# Patient Record
Sex: Female | Born: 1955 | Race: White | Hispanic: No | Smoking: Never smoker
Health system: Southern US, Community
[De-identification: ages and names within clinical notes are randomized; demographics above are authoritative.]

## PROBLEM LIST (undated history)

## (undated) DIAGNOSIS — R112 Nausea with vomiting, unspecified: Secondary | ICD-10-CM

## (undated) DIAGNOSIS — I10 Essential (primary) hypertension: Secondary | ICD-10-CM

## (undated) DIAGNOSIS — Z9889 Other specified postprocedural states: Secondary | ICD-10-CM

## (undated) DIAGNOSIS — R51 Headache: Secondary | ICD-10-CM

## (undated) DIAGNOSIS — N200 Calculus of kidney: Secondary | ICD-10-CM

## (undated) HISTORY — PX: ABDOMINAL HYSTERECTOMY: SHX81

## (undated) HISTORY — PX: COLONOSCOPY: SHX174

## (undated) HISTORY — PX: BACK SURGERY: SHX140

## (undated) HISTORY — PX: JOINT REPLACEMENT: SHX530

## (undated) HISTORY — PX: TUBAL LIGATION: SHX77

---

## 2002-08-09 ENCOUNTER — Other Ambulatory Visit: Admission: RE | Admit: 2002-08-09 | Discharge: 2002-08-09 | Payer: Self-pay | Admitting: Family Medicine

## 2003-04-05 ENCOUNTER — Encounter: Payer: Self-pay | Admitting: Family Medicine

## 2003-04-05 ENCOUNTER — Encounter: Admission: RE | Admit: 2003-04-05 | Discharge: 2003-04-05 | Payer: Self-pay | Admitting: Family Medicine

## 2003-04-25 ENCOUNTER — Encounter: Admission: RE | Admit: 2003-04-25 | Discharge: 2003-04-25 | Payer: Self-pay | Admitting: Family Medicine

## 2003-04-25 ENCOUNTER — Encounter: Payer: Self-pay | Admitting: Family Medicine

## 2003-07-26 ENCOUNTER — Encounter: Payer: Self-pay | Admitting: Radiology

## 2003-07-26 ENCOUNTER — Encounter: Payer: Self-pay | Admitting: Neurosurgery

## 2003-07-26 ENCOUNTER — Encounter: Admission: RE | Admit: 2003-07-26 | Discharge: 2003-07-26 | Payer: Self-pay | Admitting: Neurosurgery

## 2003-08-16 ENCOUNTER — Encounter: Payer: Self-pay | Admitting: Neurosurgery

## 2003-08-16 ENCOUNTER — Encounter: Admission: RE | Admit: 2003-08-16 | Discharge: 2003-08-16 | Payer: Self-pay | Admitting: Neurosurgery

## 2003-08-29 ENCOUNTER — Encounter: Admission: RE | Admit: 2003-08-29 | Discharge: 2003-08-29 | Payer: Self-pay | Admitting: Neurosurgery

## 2003-08-29 ENCOUNTER — Encounter: Payer: Self-pay | Admitting: Neurosurgery

## 2003-12-02 ENCOUNTER — Ambulatory Visit (HOSPITAL_COMMUNITY): Admission: RE | Admit: 2003-12-02 | Discharge: 2003-12-02 | Payer: Self-pay | Admitting: Neurosurgery

## 2003-12-25 ENCOUNTER — Encounter: Admission: RE | Admit: 2003-12-25 | Discharge: 2003-12-25 | Payer: Self-pay | Admitting: *Deleted

## 2003-12-30 ENCOUNTER — Encounter: Admission: RE | Admit: 2003-12-30 | Discharge: 2003-12-30 | Payer: Self-pay | Admitting: Neurosurgery

## 2004-01-13 ENCOUNTER — Encounter: Admission: RE | Admit: 2004-01-13 | Discharge: 2004-01-13 | Payer: Self-pay | Admitting: Neurosurgery

## 2004-01-28 ENCOUNTER — Emergency Department (HOSPITAL_COMMUNITY): Admission: EM | Admit: 2004-01-28 | Discharge: 2004-01-28 | Payer: Self-pay | Admitting: Emergency Medicine

## 2004-01-31 ENCOUNTER — Encounter: Admission: RE | Admit: 2004-01-31 | Discharge: 2004-01-31 | Payer: Self-pay | Admitting: Neurosurgery

## 2005-06-11 ENCOUNTER — Encounter: Admission: RE | Admit: 2005-06-11 | Discharge: 2005-06-11 | Payer: Self-pay | Admitting: Internal Medicine

## 2006-03-30 ENCOUNTER — Encounter: Admission: RE | Admit: 2006-03-30 | Discharge: 2006-03-30 | Payer: Self-pay | Admitting: Internal Medicine

## 2008-03-18 ENCOUNTER — Encounter: Admission: RE | Admit: 2008-03-18 | Discharge: 2008-03-18 | Payer: Self-pay | Admitting: Internal Medicine

## 2009-12-17 ENCOUNTER — Encounter: Admission: RE | Admit: 2009-12-17 | Discharge: 2009-12-17 | Payer: Self-pay | Admitting: Family Medicine

## 2010-12-14 ENCOUNTER — Inpatient Hospital Stay (HOSPITAL_COMMUNITY)
Admission: RE | Admit: 2010-12-14 | Discharge: 2010-12-17 | Payer: Self-pay | Source: Home / Self Care | Attending: Orthopedic Surgery | Admitting: Orthopedic Surgery

## 2010-12-14 LAB — DIFFERENTIAL
Basophils Absolute: 0 10*3/uL (ref 0.0–0.1)
Basophils Relative: 1 % (ref 0–1)
Eosinophils Absolute: 0.3 10*3/uL (ref 0.0–0.7)
Eosinophils Relative: 4 % (ref 0–5)
Lymphocytes Relative: 35 % (ref 12–46)
Lymphs Abs: 2.5 10*3/uL (ref 0.7–4.0)
Monocytes Absolute: 0.3 10*3/uL (ref 0.1–1.0)
Monocytes Relative: 4 % (ref 3–12)
Neutro Abs: 4.2 10*3/uL (ref 1.7–7.7)
Neutrophils Relative %: 57 % (ref 43–77)

## 2010-12-14 LAB — CBC
HCT: 41.1 % (ref 36.0–46.0)
Hemoglobin: 14.4 g/dL (ref 12.0–15.0)
MCH: 30.8 pg (ref 26.0–34.0)
MCHC: 35 g/dL (ref 30.0–36.0)
MCV: 88 fL (ref 78.0–100.0)
Platelets: 232 10*3/uL (ref 150–400)
RBC: 4.67 MIL/uL (ref 3.87–5.11)
RDW: 13.4 % (ref 11.5–15.5)
WBC: 7.4 10*3/uL (ref 4.0–10.5)

## 2010-12-14 LAB — URINALYSIS, ROUTINE W REFLEX MICROSCOPIC
Bilirubin Urine: NEGATIVE
Ketones, ur: NEGATIVE mg/dL
Leukocytes, UA: NEGATIVE
Nitrite: NEGATIVE
Protein, ur: NEGATIVE mg/dL
Specific Gravity, Urine: 1.006 (ref 1.005–1.030)
Urine Glucose, Fasting: NEGATIVE mg/dL
Urobilinogen, UA: 0.2 mg/dL (ref 0.0–1.0)
pH: 7 (ref 5.0–8.0)

## 2010-12-14 LAB — BASIC METABOLIC PANEL
BUN: 10 mg/dL (ref 6–23)
CO2: 29 mEq/L (ref 19–32)
Calcium: 9.6 mg/dL (ref 8.4–10.5)
Chloride: 102 mEq/L (ref 96–112)
Creatinine, Ser: 0.78 mg/dL (ref 0.4–1.2)
GFR calc Af Amer: >60 mL/min (ref >60)
GFR calc non Af Amer: >60 mL/min (ref >60)
Glucose, Bld: 113 mg/dL — ABNORMAL HIGH (ref 70–99)
Potassium: 3.6 mEq/L (ref 3.5–5.1)
Sodium: 140 mEq/L (ref 135–145)

## 2010-12-14 LAB — PROTIME-INR
INR: 0.89 (ref 0.00–1.49)
Prothrombin Time: 12.2 seconds (ref 11.6–15.2)

## 2010-12-14 LAB — URINE MICROSCOPIC-ADD ON

## 2010-12-14 LAB — SURGICAL PCR SCREEN
MRSA, PCR: NEGATIVE
Staphylococcus aureus: NEGATIVE

## 2010-12-14 LAB — APTT: aPTT: 27 seconds (ref 24–37)

## 2010-12-16 LAB — CBC
HCT: 35.2 % — ABNORMAL LOW (ref 36.0–46.0)
Hemoglobin: 11.7 g/dL — ABNORMAL LOW (ref 12.0–15.0)
MCH: 29.3 pg (ref 26.0–34.0)
MCHC: 33.2 g/dL (ref 30.0–36.0)
MCV: 88 fL (ref 78.0–100.0)
Platelets: 200 10*3/uL (ref 150–400)
RBC: 4 MIL/uL (ref 3.87–5.11)
RDW: 13.4 % (ref 11.5–15.5)
WBC: 10.4 10*3/uL (ref 4.0–10.5)

## 2010-12-16 LAB — BASIC METABOLIC PANEL
BUN: 8 mg/dL (ref 6–23)
CO2: 27 mEq/L (ref 19–32)
Calcium: 8.1 mg/dL — ABNORMAL LOW (ref 8.4–10.5)
Chloride: 103 mEq/L (ref 96–112)
Creatinine, Ser: 0.83 mg/dL (ref 0.4–1.2)
GFR calc Af Amer: >60 mL/min (ref >60)
GFR calc non Af Amer: >60 mL/min (ref >60)
Glucose, Bld: 158 mg/dL — ABNORMAL HIGH (ref 70–99)
Potassium: 4 mEq/L (ref 3.5–5.1)
Sodium: 135 mEq/L (ref 135–145)

## 2010-12-16 LAB — PROTIME-INR
INR: 1.03 (ref 0.00–1.49)
Prothrombin Time: 13.7 seconds (ref 11.6–15.2)

## 2010-12-16 LAB — ABO/RH: ABO/RH(D): A POS

## 2010-12-16 LAB — TYPE AND SCREEN
ABO/RH(D): A POS
Antibody Screen: NEGATIVE

## 2010-12-17 NOTE — Op Note (Signed)
NAMETRISTINE, LANGI               ACCOUNT NO.:  [id number]  MEDICAL RECORD NO.:  [id number]          PATIENT TYPE:  INP  LOCATION:  5030                         FACILITY:  [facility]  PHYSICIAN:  Feliberto Gottron. Turner Daniels, M.D.   DATE OF BIRTH:  1956/02/06  DATE OF PROCEDURE:  12/14/2010 DATE OF DISCHARGE:                              OPERATIVE REPORT   PREOPERATIVE DIAGNOSIS:  End-stage arthritis of the left knee.  POSTOPERATIVE DIAGNOSIS:  End-stage arthritis of the left knee.  PROCEDURE:  Left total knee arthroplasty using DePuy Sigma RP components, 4 narrow left femur, 4 tibial baseplate, 10-mm Sigma RP bearing, and a 35-mm patellar button double batch of DePuy, HV cement with 1500 mg of Zinacef.  All components cemented.  SURGEON:  Feliberto Gottron. Turner Daniels, MD  FIRST ASSISTANT:  Shirl Harris, PA-C.  ANESTHETIC:  General endotracheal.  ESTIMATED BLOOD LOSS:  Minimal.  FLUID REPLACEMENT:  1500 mL of crystalloid.  DRAINS PLACED:  Foley catheter, 2 medium Hemovac.  TOURNIQUET TIME:  One hour and 25 minutes.  INDICATIONS FOR PROCEDURE:  The patient is a 55 year old woman with bare bone arthritis of her left knee along the medial compartment who has failed conservative treatment with anti-inflammatory medicines, physical therapy, intra-articular injections of cortisone.  Discussed supplementation and finally arthroscopic debridement which revealed bare bone arthritic changes to the far medial aspects of the medial tibial plateau and medial femoral condyle.  She desires elective left total knee arthroplasty.  Risks and benefits of surgery were discussed, questions answered.  DESCRIPTION OF PROCEDURE:  The patient identified by armband and received preoperative IV antibiotics in the holding area at [facility], taken to operating room 5, appropriate anesthetic monitors were attached, endotracheal anesthesia induced with the patient in the supine position.  A Foley catheter was  inserted.  Lateral post and foot positioner applied to the table.  Tourniquet applied high to the left thigh.  Left lower extremity prepped and draped in usual sterile fashion from the ankle to the tourniquet.  Time-out procedure was performed. The limb was then wrapped with an Esmarch bandage.  The tourniquet inflated to 350 mmHg.  We began the operation itself by making an anterior midline incision one handbreadth above the patella over the patella and then 1 cm medial to and 3 cm distal to the tibial tubercle. Small bleeders in the skin and subcutaneous tissue were identified and cauterized.  Transverse retinaculum was incised and reflected medially allowing a medial parapatellar arthrotomy.  The patella was everted, prepatellar fat pad was resected.  Superficial medial collateral ligament was elevated from anterior to posterior off the proximal flare of the tibia and the patella was everted, the knee was hyperflexed. This allowed [place] to resect the anterior one half of the menisci as well as the cruciate ligaments after first removing large notch osteophytes. With the knee hyperflexed, we then entered the proximal tibia in line with the shaft of the tibia with a step drill followed by the intramedullary guide and 2-degree posterior slope cutting guide.  The cutting guide was set so that we resected 4-5 mm of bone medially, 8-9 mm of  bone laterally, and a proximal tibial cut accomplished without difficulty.  We then entered the distal femur 2 mm anterior to the PCL origin with a step drill followed by the IM rod and a distal femoral cutting guide set at 11 mm, pinned along the epicondylar axis.  The distal 11 mm of the femur were resected.  The posterior referencing cutting guide was then affixed to the femur and we measured for a 4 chamfer cutting guide, although by measuring with the trials, it looked like would be a 4 narrow.  The cutting guide was pinned in 3 degrees of external  rotation and the anterior, posterior, and chamfer cuts accomplished without difficulty followed by the Sigma RP box cut.  The 4 narrow did have the best fit.  At this point, the knee was brought into full extension.  We removed the posterior half of the meniscus, checked our extension gap which was good for a 10-mm bearing as well as the flexion gap.  We measured the patella 22 mm, felt it would fit a 32 or 35 button.  Cutting guide was set at 14 and the posterior 8-9 mm of the patella resected, sized for 35 button and drilled.  Once again, the knee was hyperflexed, exposing the proximal tibia.  We sized for #4 tibial baseplate, pinned it in place, brought up the smokestack and conical reamer followed by the Delta fin keel punch.  We then hammered into place a 4 left trial, drilled the lugs, inserted a 10-mm bearing.  A 35 trial patella was then also placed, the knee was brought to full extension and flexed to 140 degrees without difficulty.  Collateral ligaments were noted to be stable.  Trial components were removed at this time.  All bony surface were water picked, cleaned, dried with suction and sponges.  Double batch of DePuy HV cement was then mixed and applied to all bony metallic mating surfaces except for the posterior condyles of the femur itself.  In order we hammered into place a 4 tibial baseplate and removed excess cement, a 4 left femoral component and removed excess cement, inserted a 10-mm Sigma RP bearing and reduced the knee.  The 35 patellar button was squeezed into place with a clamp and excess cement removed.  The wound was water picked, cleaned with pulse lavage.  Medium Hemovac drains were placed from the anterolateral approach.  After the cement cured, we removed the clamp, reduced the patella, took the knee through range of motion, noted tracking with no thumb pressure and at this point, the parapatellar arthrotomy was closed with running #1 Vicryl suture, the  subcutaneous tissue with 0 and 2-0 undyed Vicryl suture, and the skin with skin staples.  A dressing of Xeroform, 4 x 4s, dressing sponges, Webril, and Ace wrap applied. Tourniquet let down.  The patient awakened, extubated, and taken to the recovery room without difficulty.     Feliberto Gottron. Turner Daniels, M.D.     Ovid Curd  D:  12/14/2010  T:  12/15/2010  Job:  [id number]  Electronically Signed by Gean Birchwood M.D. on 12/17/2010 01:25:46 PM

## 2010-12-21 LAB — CBC
HCT: 34.4 % — ABNORMAL LOW (ref 36.0–46.0)
HCT: 29.9 % — ABNORMAL LOW (ref 36.0–46.0)
Hemoglobin: 11.4 g/dL — ABNORMAL LOW (ref 12.0–15.0)
Hemoglobin: 9.9 g/dL — ABNORMAL LOW (ref 12.0–15.0)
MCH: 29.3 pg (ref 26.0–34.0)
MCH: 29.3 pg (ref 26.0–34.0)
MCHC: 33.1 g/dL (ref 30.0–36.0)
MCHC: 33.1 g/dL (ref 30.0–36.0)
MCV: 88.4 fL (ref 78.0–100.0)
MCV: 88.5 fL (ref 78.0–100.0)
Platelets: 176 10*3/uL (ref 150–400)
Platelets: 156 10*3/uL (ref 150–400)
RBC: 3.89 MIL/uL (ref 3.87–5.11)
RBC: 3.38 MIL/uL — ABNORMAL LOW (ref 3.87–5.11)
RDW: 13.4 % (ref 11.5–15.5)
RDW: 13.4 % (ref 11.5–15.5)
WBC: 11 10*3/uL — ABNORMAL HIGH (ref 4.0–10.5)
WBC: 7.7 10*3/uL (ref 4.0–10.5)

## 2010-12-21 LAB — PROTIME-INR
INR: 1.22 (ref 0.00–1.49)
Prothrombin Time: 15.6 seconds — ABNORMAL HIGH (ref 11.6–15.2)
Prothrombin Time: 18.7 seconds — ABNORMAL HIGH (ref 11.6–15.2)

## 2011-01-08 NOTE — Discharge Summary (Signed)
Bridget Salas, [place]               ACCOUNT NO.:  [id number]  MEDICAL RECORD NO.:  [id number]          PATIENT TYPE:  INP  LOCATION:  5030                         FACILITY:  [facility]  PHYSICIAN:  Feliberto Gottron. Turner Daniels, M.D.   DATE OF BIRTH:  1956/08/07  DATE OF ADMISSION:  12/14/2010 DATE OF DISCHARGE:  12/17/2010                              DISCHARGE SUMMARY   CHIEF COMPLAINT:  Left knee pain.  HISTORY OF PRESENT ILLNESS:  This is a 55 year old lady who complains of severe unremitting pain in her left knee despite extensive conservative treatment with antiinflammatories, cortisone injection, viscosupplementation, and arthroscopy.  She now desires a surgical intervention.  All risks and benefits of surgery were discussed with the patient.  PAST MEDICAL HISTORY:  Significant for migraines.  PAST SURGICAL HISTORY:  Significant for hysterectomy, diskectomy, and left knee arthroscopy.  SOCIAL HISTORY:  She denies use of alcohol or tobacco.  FAMILY HISTORY:  Positive for diabetes, hypertension, and coronary artery disease.  She has no known drug allergies.  PHYSICAL EXAMINATION:  Gross examination of the left knee demonstrates range of motion to be 0-110 degrees.  She is tender to palpation along the medial joint line and is neurovascularly intact.  X-rays demonstrate significant arthritic changes in the medial compartment of the left knee.  At the time of arthroscopy, she was found to have grade 4 chondromalacia.  PREOPERATIVE LABORATORY DATA:  White blood cells 7.4, red blood cells 4.67, hemoglobin 14.4, hematocrit 41.1, platelets 232.  PT 12.2, INR 0.89, PTT 27.  Sodium 140, potassium 3.6, chloride 102, glucose 113, BUN 10, creatinine 0.78.  Urinalysis was within normal limits.  HOSPITAL COURSE:  [name] was admitted to [facility] on December 14, 2010, when she underwent left total knee arthroplasty.  The procedure was performed by Dr. Gean Birchwood, and she tolerated it well.   Two Hemovac drains were placed in the left knee.  A perioperative Foley catheter was also placed.  She was transferred to the floor on Lovenox and Coumadin for DVT prophylaxis.  On the first postoperative day, she was awake and alert.  She is reporting 9/10 pain in her left knee, but seemed to be tolerating p.o. intake well.  Hemoglobin was 11.7.  Her drains were removed without difficulty.  On the second postoperative day.  She reported improvement in her knee pain.  Hemoglobin was 11.4. Surgical dressing was changed, and her incision was found to be benign. On postoperative day #3, she had passed all of her physical therapy goals.  She was eating well.  Hemoglobin was 11.4.  Surgical dressing remained clean, and she was discharged home.  DISPOSITION:  The patient was discharged home on December 17, 2010.  She was weightbearing as tolerated and would return to the clinic in 10 days for x-rays and staple removal.  Home health care would manage her wound, Coumadin, and physical therapy.  She will be on Coumadin for 14 days with a target INR of 1.5-2.  FINAL DIAGNOSIS:  End-stage degenerative joint disease of the left knee.     Shirl Harris, PA   ______________________________ Feliberto Gottron. Turner Daniels, M.D.  JW/MEDQ  D:  01/05/2011  T:  01/05/2011  Job:  [id number]  Electronically Signed by Shirl Harris PA on 01/07/2011 01:27:13 PM Electronically Signed by Gean Birchwood M.D. on 01/08/2011 10:07:22 PM

## 2011-04-16 NOTE — Op Note (Signed)
NAMEMAKAYLE, Bridget Salas                         ACCOUNT NO.:  [id number]   MEDICAL RECORD NO.:  [id number]                   PATIENT TYPE:  OIB   LOCATION:  2899                                 FACILITY:  [facility]   PHYSICIAN:  Kathaleen Maser. Pool, M.D.                 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   OPERATION PERFORMED:  Right L4-5 extraforaminal microdiskectomy.   SURGEON:  Kathaleen Maser. Pool, M.D.   ASSISTANT:  Donalee Citrin, M.D.   ANESTHESIA:  General endotracheal.   INDICATIONS FOR PROCEDURE:  [name] is a 55 year old female with a  history of right lower extremity pain consistent with a right-sided L4  radiculopathy which has failed conservative treatment. MRI scan demonstrates  evidence of foraminal and extraforaminal disk herniation off to the right  side at L4-5 with compression of the exiting L4 nerve root.  The patient was  then counseled as to her options.  She has decided to proceed with a right-  sided L4-5 extraforaminal microdiskectomy for hopeful improvement of the  symptoms.   DESCRIPTION OF PROCEDURE:  The patient was taken to the operating room and  placed on the table in the supine position.  After adequate level of  anesthesia was achieved, the patient was positioned prone onto a Wilson  frame and appropriately padded.  The patient's lumbar region was prepped and  draped sterilely.  A 10 blade was used to make a linear skin incision  overlying the L4-5 interspace.  This was carried down sharply in the  midline.  A subperiosteal dissection was then performed exposing the lamina  and facet joints of L4 and L5 on the right as well as the transverse  processes of L4.  Deep  self-retaining retractor was placed.  Intraoperative  x-ray was taken, the level was confirmed.  Extraforaminal approach was then  performed using high speed drill and Kerrison rongeurs to remove the lateral  aspect of the superior facet of L5 and the inferior facet of L4.  The  intertransverse ligament was elevated and resected in piecemeal fashion  using Kerrison rongeurs.   The microscope was brought into the field and used for microdissection of  the right-sided L4 nerve root and underlying disk herniation.  The right-  sided L4 nerve root was identified and dissected free.  Epidural fat was  coagulated and resected.  The disk space was isolated and incised with a 15  blade in rectangular fashion.  A wide disk space cleanout was then achieved  pituitary rongeurs and upward angled pituitary rongeurs and Epstein curets.  All elements of the foraminal and extraforaminal disk herniation were  completely removed.  This completely decompressed the right-sided L4 nerve  root.  There was no evidence of any residual compression.  The wound was  then irrigated with antibiotic solution.  Gelfoam was placed topically for  hemostasis which was found to be good.  Microscope and retractor system were removed.  Hemostasis in the muscle was  achieved with electrocautery.  The wound was then closed in layers with  Vicryl sutures.  Steri-Strips and sterile dressing were applied.  There were  no apparent complications.  The patient tolerated the procedure well and  returned to the recovery room postoperatively.                                               Henry A. Pool, M.D.    HAP/MEDQ  D:  12/02/2003  T:  12/02/2003  Job:  [id number]

## 2014-05-20 ENCOUNTER — Encounter (HOSPITAL_COMMUNITY): Payer: Self-pay | Admitting: Pharmacist

## 2014-05-23 ENCOUNTER — Encounter (HOSPITAL_COMMUNITY): Payer: Self-pay | Admitting: *Deleted

## 2014-05-29 ENCOUNTER — Encounter (HOSPITAL_COMMUNITY): Payer: Managed Care, Other (non HMO) | Admitting: Anesthesiology

## 2014-05-29 ENCOUNTER — Encounter (HOSPITAL_COMMUNITY): Payer: Self-pay | Admitting: *Deleted

## 2014-05-29 ENCOUNTER — Ambulatory Visit (HOSPITAL_COMMUNITY): Payer: Managed Care, Other (non HMO) | Admitting: Anesthesiology

## 2014-05-29 ENCOUNTER — Ambulatory Visit (HOSPITAL_COMMUNITY)
Admission: RE | Admit: 2014-05-29 | Discharge: 2014-05-30 | Disposition: A | Discharge status: Home / Self Care | Payer: Managed Care, Other (non HMO) | Source: Ambulatory Visit | Attending: Obstetrics and Gynecology | Admitting: Obstetrics and Gynecology

## 2014-05-29 ENCOUNTER — Encounter (HOSPITAL_COMMUNITY)
Admission: RE | Disposition: A | Discharge status: Home / Self Care | Payer: Self-pay | Source: Ambulatory Visit | Attending: Obstetrics and Gynecology

## 2014-05-29 DIAGNOSIS — N8111 Cystocele, midline: Secondary | ICD-10-CM | POA: Insufficient documentation

## 2014-05-29 DIAGNOSIS — E7800 Pure hypercholesterolemia, unspecified: Secondary | ICD-10-CM | POA: Insufficient documentation

## 2014-05-29 DIAGNOSIS — N200 Calculus of kidney: Secondary | ICD-10-CM | POA: Insufficient documentation

## 2014-05-29 DIAGNOSIS — Z9889 Other specified postprocedural states: Secondary | ICD-10-CM

## 2014-05-29 DIAGNOSIS — Z833 Family history of diabetes mellitus: Secondary | ICD-10-CM | POA: Insufficient documentation

## 2014-05-29 DIAGNOSIS — Z8249 Family history of ischemic heart disease and other diseases of the circulatory system: Secondary | ICD-10-CM | POA: Insufficient documentation

## 2014-05-29 DIAGNOSIS — IMO0002 Reserved for concepts with insufficient information to code with codable children: Secondary | ICD-10-CM

## 2014-05-29 DIAGNOSIS — Z801 Family history of malignant neoplasm of trachea, bronchus and lung: Secondary | ICD-10-CM | POA: Insufficient documentation

## 2014-05-29 DIAGNOSIS — D071 Carcinoma in situ of vulva: Secondary | ICD-10-CM | POA: Insufficient documentation

## 2014-05-29 HISTORY — DX: Nausea with vomiting, unspecified: R11.2

## 2014-05-29 HISTORY — DX: Headache: R51

## 2014-05-29 HISTORY — DX: Other specified postprocedural states: Z98.890

## 2014-05-29 HISTORY — DX: Calculus of kidney: N20.0

## 2014-05-29 HISTORY — PX: CYSTOCELE REPAIR: SHX163

## 2014-05-29 LAB — CBC
HCT: 39.1 % (ref 36.0–46.0)
Hemoglobin: 13.4 g/dL (ref 12.0–15.0)
MCH: 31.4 pg (ref 26.0–34.0)
MCHC: 34.3 g/dL (ref 30.0–36.0)
MCV: 91.6 fL (ref 78.0–100.0)
PLATELETS: 175 10*3/uL (ref 150–400)
RBC: 4.27 MIL/uL (ref 3.87–5.11)
RDW: 13.9 % (ref 11.5–15.5)
WBC: 5.4 10*3/uL (ref 4.0–10.5)

## 2014-05-29 LAB — TYPE AND SCREEN
ABO/RH(D): A POS
Antibody Screen: NEGATIVE

## 2014-05-29 LAB — ABO/RH: ABO/RH(D): A POS

## 2014-05-29 SURGERY — COLPORRHAPHY, ANTERIOR, FOR CYSTOCELE REPAIR
Anesthesia: Spinal | Site: Vagina

## 2014-05-29 MED ORDER — ONDANSETRON HCL 4 MG/2ML IJ SOLN: 4.0000 mg | Freq: Four times a day (QID) | INTRAMUSCULAR | Status: DC | PRN

## 2014-05-29 MED ORDER — ESTRADIOL 0.1 MG/GM VA CREA
TOPICAL_CREAM | VAGINAL | Status: DC | PRN
  Administered 2014-05-29: 1 via VAGINAL

## 2014-05-29 MED ORDER — CEFAZOLIN SODIUM-DEXTROSE 2-3 GM-% IV SOLR
2.0000 g | INTRAVENOUS | Status: AC
  Administered 2014-05-29: 2 g via INTRAVENOUS

## 2014-05-29 MED ORDER — BUPIVACAINE IN DEXTROSE 0.75-8.25 % IT SOLN
INTRATHECAL | Status: DC | PRN
  Administered 2014-05-29: 15 mg via INTRATHECAL

## 2014-05-29 MED ORDER — MIDAZOLAM HCL 2 MG/2ML IJ SOLN: 0.5000 mg | Freq: Once | INTRAMUSCULAR | Status: DC | PRN

## 2014-05-29 MED ORDER — OXYCODONE-ACETAMINOPHEN 5-325 MG PO TABS: 1.0000 | ORAL_TABLET | ORAL | Status: DC | PRN

## 2014-05-29 MED ORDER — NALOXONE HCL 0.4 MG/ML IJ SOLN: 0.4000 mg | INTRAMUSCULAR | Status: DC | PRN

## 2014-05-29 MED ORDER — IBUPROFEN 800 MG PO TABS
800.0000 mg | ORAL_TABLET | Freq: Three times a day (TID) | ORAL | Status: DC
  Administered 2014-05-29 – 2014-05-30 (×2): 800 mg via ORAL
  Filled 2014-05-29 (×2): qty 1

## 2014-05-29 MED ORDER — MIDAZOLAM HCL 2 MG/2ML IJ SOLN
INTRAMUSCULAR | Status: AC
  Filled 2014-05-29: qty 2

## 2014-05-29 MED ORDER — LIDOCAINE HCL (CARDIAC) 20 MG/ML IV SOLN
INTRAVENOUS | Status: AC
  Filled 2014-05-29: qty 5

## 2014-05-29 MED ORDER — LIDOCAINE HCL (CARDIAC) 20 MG/ML IV SOLN
INTRAVENOUS | Status: DC | PRN
  Administered 2014-05-29: 50 mg via INTRAVENOUS

## 2014-05-29 MED ORDER — CEFAZOLIN SODIUM-DEXTROSE 2-3 GM-% IV SOLR
INTRAVENOUS | Status: AC
  Filled 2014-05-29: qty 50

## 2014-05-29 MED ORDER — DIPHENHYDRAMINE HCL 12.5 MG/5ML PO ELIX: 12.5000 mg | ORAL_SOLUTION | Freq: Four times a day (QID) | ORAL | Status: DC | PRN

## 2014-05-29 MED ORDER — DEXAMETHASONE SODIUM PHOSPHATE 10 MG/ML IJ SOLN
INTRAMUSCULAR | Status: AC
  Filled 2014-05-29: qty 1

## 2014-05-29 MED ORDER — SUMATRIPTAN SUCCINATE 100 MG PO TABS: 100.0000 mg | ORAL_TABLET | ORAL | Status: DC | PRN

## 2014-05-29 MED ORDER — DEXAMETHASONE SODIUM PHOSPHATE 10 MG/ML IJ SOLN
INTRAMUSCULAR | Status: DC | PRN
  Administered 2014-05-29: 5 mg via INTRAVENOUS

## 2014-05-29 MED ORDER — DIPHENHYDRAMINE HCL 50 MG/ML IJ SOLN: 12.5000 mg | Freq: Four times a day (QID) | INTRAMUSCULAR | Status: DC | PRN

## 2014-05-29 MED ORDER — FENTANYL CITRATE 0.05 MG/ML IJ SOLN
25.0000 ug | INTRAMUSCULAR | Status: DC | PRN
Start: 2014-05-29 — End: 2014-05-29

## 2014-05-29 MED ORDER — FENTANYL CITRATE 0.05 MG/ML IJ SOLN
INTRAMUSCULAR | Status: AC
  Filled 2014-05-29: qty 5

## 2014-05-29 MED ORDER — BUPIVACAINE-EPINEPHRINE (PF) 0.25% -1:200000 IJ SOLN
INTRAMUSCULAR | Status: AC
Start: 2014-05-29 — End: 2014-05-29
  Filled 2014-05-29: qty 30

## 2014-05-29 MED ORDER — EPHEDRINE 5 MG/ML INJ
INTRAVENOUS | Status: AC
  Filled 2014-05-29: qty 10

## 2014-05-29 MED ORDER — EPHEDRINE SULFATE 50 MG/ML IJ SOLN
INTRAMUSCULAR | Status: DC | PRN
  Administered 2014-05-29 (×6): 5 mg via INTRAVENOUS

## 2014-05-29 MED ORDER — HYDROMORPHONE 0.3 MG/ML IV SOLN
INTRAVENOUS | Status: DC
  Administered 2014-05-29: 17:00:00 via INTRAVENOUS
  Filled 2014-05-29: qty 25

## 2014-05-29 MED ORDER — PROPOFOL 10 MG/ML IV EMUL
INTRAVENOUS | Status: AC
  Filled 2014-05-29: qty 20

## 2014-05-29 MED ORDER — DOCUSATE SODIUM 100 MG PO CAPS
100.0000 mg | ORAL_CAPSULE | Freq: Two times a day (BID) | ORAL | Status: DC
  Administered 2014-05-29: 100 mg via ORAL
  Filled 2014-05-29: qty 1

## 2014-05-29 MED ORDER — LACTATED RINGERS IV SOLN
INTRAVENOUS | Status: DC
  Administered 2014-05-29 (×2): via INTRAVENOUS

## 2014-05-29 MED ORDER — PROPOFOL INFUSION 10 MG/ML OPTIME
INTRAVENOUS | Status: DC | PRN
  Administered 2014-05-29: 50 ug/kg/min via INTRAVENOUS

## 2014-05-29 MED ORDER — SODIUM CHLORIDE 0.9 % IJ SOLN: 9.0000 mL | INTRAMUSCULAR | Status: DC | PRN

## 2014-05-29 MED ORDER — MEPERIDINE HCL 25 MG/ML IJ SOLN: 6.2500 mg | INTRAMUSCULAR | Status: DC | PRN

## 2014-05-29 MED ORDER — FENTANYL CITRATE 0.05 MG/ML IJ SOLN
INTRAMUSCULAR | Status: DC | PRN
  Administered 2014-05-29: 25 ug via INTRATHECAL

## 2014-05-29 MED ORDER — ONDANSETRON HCL 4 MG/2ML IJ SOLN
INTRAMUSCULAR | Status: DC | PRN
  Administered 2014-05-29: 4 mg via INTRAVENOUS

## 2014-05-29 MED ORDER — ZOLPIDEM TARTRATE 5 MG PO TABS
5.0000 mg | ORAL_TABLET | Freq: Every evening | ORAL | Status: DC | PRN
  Administered 2014-05-29: 5 mg via ORAL
  Filled 2014-05-29: qty 1

## 2014-05-29 MED ORDER — ALUM & MAG HYDROXIDE-SIMETH 200-200-20 MG/5ML PO SUSP: 30.0000 mL | ORAL | Status: DC | PRN

## 2014-05-29 MED ORDER — KETOROLAC TROMETHAMINE 30 MG/ML IJ SOLN
INTRAMUSCULAR | Status: DC | PRN
  Administered 2014-05-29: 30 mg via INTRAVENOUS

## 2014-05-29 MED ORDER — KETOROLAC TROMETHAMINE 30 MG/ML IJ SOLN: 15.0000 mg | Freq: Once | INTRAMUSCULAR | Status: DC | PRN

## 2014-05-29 MED ORDER — MENTHOL 3 MG MT LOZG: 1.0000 | LOZENGE | OROMUCOSAL | Status: DC | PRN

## 2014-05-29 MED ORDER — MIDAZOLAM HCL 2 MG/2ML IJ SOLN
INTRAMUSCULAR | Status: DC | PRN
  Administered 2014-05-29: 2 mg via INTRAVENOUS

## 2014-05-29 MED ORDER — FENTANYL CITRATE 0.05 MG/ML IJ SOLN
INTRAMUSCULAR | Status: DC | PRN
  Administered 2014-05-29: 25 ug via INTRAVENOUS
  Administered 2014-05-29: 50 ug via INTRAVENOUS

## 2014-05-29 MED ORDER — BUPIVACAINE-EPINEPHRINE 0.25% -1:200000 IJ SOLN
INTRAMUSCULAR | Status: DC | PRN
  Administered 2014-05-29: 14 mL

## 2014-05-29 MED ORDER — ONDANSETRON HCL 4 MG/2ML IJ SOLN
INTRAMUSCULAR | Status: AC
  Filled 2014-05-29: qty 2

## 2014-05-29 MED ORDER — SIMETHICONE 80 MG PO CHEW: 80.0000 mg | CHEWABLE_TABLET | Freq: Four times a day (QID) | ORAL | Status: DC | PRN

## 2014-05-29 MED ORDER — ONDANSETRON HCL 4 MG PO TABS: 4.0000 mg | ORAL_TABLET | Freq: Four times a day (QID) | ORAL | Status: DC | PRN

## 2014-05-29 MED ORDER — ACETAMINOPHEN 10 MG/ML IV SOLN
1000.0000 mg | Freq: Once | INTRAVENOUS | Status: AC
  Administered 2014-05-29: 1000 mg via INTRAVENOUS
  Filled 2014-05-29: qty 100

## 2014-05-29 MED ORDER — PROMETHAZINE HCL 25 MG/ML IJ SOLN: 6.2500 mg | INTRAMUSCULAR | Status: DC | PRN

## 2014-05-29 MED ORDER — LACTATED RINGERS IV SOLN
INTRAVENOUS | Status: DC
  Administered 2014-05-29: 18:00:00 via INTRAVENOUS

## 2014-05-29 MED ORDER — ESTRADIOL 0.1 MG/GM VA CREA
TOPICAL_CREAM | VAGINAL | Status: AC
  Filled 2014-05-29: qty 42.5

## 2014-05-29 SURGICAL SUPPLY — 24 items
CATH ROBINSON RED A/P 16FR (CATHETERS) ×2 IMPLANT
CLOTH BEACON ORANGE TIMEOUT ST (SAFETY) ×2 IMPLANT
GAUZE PACKING 2X5 YD STRL (GAUZE/BANDAGES/DRESSINGS) ×2 IMPLANT
GLOVE BIO SURGEON STRL SZ 6 (GLOVE) ×2 IMPLANT
GLOVE BIO SURGEON STRL SZ7 (GLOVE) ×6 IMPLANT
GLOVE BIOGEL PI IND STRL 7.0 (GLOVE) ×3 IMPLANT
GLOVE BIOGEL PI INDICATOR 7.0 (GLOVE) ×3
GOWN STRL REUS W/TWL LRG LVL3 (GOWN DISPOSABLE) ×8 IMPLANT
NS IRRIG 1000ML POUR BTL (IV SOLUTION) ×2 IMPLANT
PACK VAGINAL WOMENS (CUSTOM PROCEDURE TRAY) ×2 IMPLANT
SUT VIC AB 0 CT1 18XCR BRD8 (SUTURE) ×1 IMPLANT
SUT VIC AB 0 CT1 27 (SUTURE) ×1
SUT VIC AB 0 CT1 27XBRD ANBCTR (SUTURE) ×1 IMPLANT
SUT VIC AB 0 CT1 8-18 (SUTURE) ×1
SUT VIC AB 2-0 CT1 27 (SUTURE)
SUT VIC AB 2-0 CT1 TAPERPNT 27 (SUTURE) IMPLANT
SUT VIC AB 2-0 SH 27 (SUTURE) ×2
SUT VIC AB 2-0 SH 27XBRD (SUTURE) ×2 IMPLANT
SUT VIC AB 2-0 UR5 27 (SUTURE) IMPLANT
SUT VIC AB 3-0 CT1 27 (SUTURE) ×1
SUT VIC AB 3-0 CT1 TAPERPNT 27 (SUTURE) ×1 IMPLANT
TOWEL OR 17X24 6PK STRL BLUE (TOWEL DISPOSABLE) ×4 IMPLANT
TRAY FOLEY CATH 14FR (SET/KITS/TRAYS/PACK) ×2 IMPLANT
WATER STERILE IRR 1000ML POUR (IV SOLUTION) ×2 IMPLANT

## 2014-05-29 NOTE — Brief Op Note (Signed)
05/29/2014  3:00 PM  PATIENT:  Bridget Salas  58 y.o. female  PRE-OPERATIVE DIAGNOSIS:  Cystocele, VIN 3  POST-OPERATIVE DIAGNOSIS:  cystocele  PROCEDURE:  Procedure(s): ANTERIOR REPAIR (CYSTOCELE) with wide  local excision (N/A) Wide local excision of vulva  SURGEON:  Surgeon(s) and Role:    * Geryl RankinsEvelyn Ghalia Reicks, MD - Primary    * Sharon SellerJennifer M Ozan, DO - Assisting  PHYSICIAN ASSISTANT: Dr. Charlotta Newtonzan  ASSISTANTS: Technician   ANESTHESIA:   local, spinal and IV sedation  EBL:  Total I/O In: 1700 [I.V.:1700] Out: 525 [Urine:500; Blood:25]  BLOOD ADMINISTERED:none  DRAINS: Urinary Catheter (Foley)   LOCAL MEDICATIONS USED:  MARCAINE   With 1:200,000 epinephrine  SPECIMEN:  Source of Specimen:  VIN 3 of perineum  DISPOSITION OF SPECIMEN:  PATHOLOGY  COUNTS:  YES  TOURNIQUET:  * No tourniquets in log *  DICTATION: .Other Dictation: Dictation Number [id number]  PLAN OF CARE: Admit for overnight observation  PATIENT DISPOSITION:  PACU - hemodynamically stable.   Delay start of Pharmacological VTE agent (>24hrs) due to surgical blood loss or risk of bleeding: yes

## 2014-05-29 NOTE — Progress Notes (Signed)
Patient ID: Bridget Salas, female   DOB: 10/16/1956, 58 y.o.   MRN: [id number]  Dictation number for op note [id number]

## 2014-05-29 NOTE — H&P (Signed)
  History of Present Illness  General:  58 y/o presents for preop. Anterior repair and wide local excision due to VIN 3 scheduled for tomorrow. Pt without complaints. Pt was using a cleanse that "thins the blood." Last used 5 days ago.   Current Medications  Taking   Cleanse and Treat , Notes: Isogenic   Not-Taking/PRN   Gummi Bear Multivitamin/Min Tablet Chewable as directed   Sumatriptan Succinate 100 mg Tablet 1 tablet as needed    Past Medical History  Hypercholesterolemia  Migraine headaches  Knee Pain Turner Daniels([place])  Kidney stones   Surgical History  hysterectomy abdominal due to uterine prolapse 1986  diskectomy   L knee surgery 2010  Left knee replacement 2012   Family History  Father: deceased, lung cancer  Mother: deceased, hypertension, diabetes Type II, heart disease, hypercholesterol, heart surgeries  Paternal Grand Father: deceased  Paternal Grand Mother: deceased, lived until [address]  Maternal Grand Father: deceased  Maternal Grand Mother: deceased, diabetes  Sister 1: deceased, suicide  Sister 2: alive  Sister 3: alive  3 sister(s) . 3 son(s) .   neg gi family hx, denies any GYN family cancer hx.   Social History  General:  Tobacco use  cigarettes: Never smoked Tobacco history last updated 05/28/2014 no Smoking.  no Alcohol.  no Caffeine.  no Recreational drug use.  Exercise: 4-5 times a week.  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  Marital Status: married.  Children: 3, son (s).    Gyn History  Sexual activity not currently sexually active.  Birth control hysterectomy.  Last pap smear date 2002.  Last mammogram date 2009.  Abnormal pap smear none.  STD none.    OB History  Number of pregnancies 3.  NVD 1 x SVD, 2 with a set of twins, EAB x 1.    Allergies  N.K.D.A.   Hospitalization/Major Diagnostic Procedure  hysterectomy   childbirth x 2    Review of Systems  Denies fever/chills, chest pain, SOB, headaches,  numbness/tingling. No h/o complication with anesthesia, bleeding disorders or blood clots.   Vital Signs  Wt 185, Ht 62, BMI 33.83, Pulse sitting 66, BP sitting 125/75.   Physical Examination  GENERAL:  Patient appears alert and oriented.  General Appearance: well-appearing, well-developed, no acute distress.  Speech: clear.  LUNGS:  Auscultation: no wheezing/rhonchi/rales. CTA bilaterally.  HEART:  Heart sounds: normal. RRR. no murmur.  ABDOMEN:  General: soft nontender, nondistended, no masses.  FEMALE GENITOURINARY:  Pelvic Not examined.  EXTREMITIES:  General: No edema or calf tenderness.     Assessments   1. Pre-op exam - V72.84 (Primary)   2. Cystocele without mention of uterine prolapse, midline - 618.01   3. 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 Pre-op exam  Notes: R/B/A of procedure discussed with pt at length. All questions answered. Consent obtained.    Follow Up  2 Weeks post op

## 2014-05-29 NOTE — Progress Notes (Signed)
In to assess pt. States pain is 0/10.  No n/v. VSS Routine post op care. Hold PCA. Discontinue vaginal packing and foley in am.

## 2014-05-29 NOTE — Anesthesia Postprocedure Evaluation (Signed)
Anesthesia Post Note  Patient: Bridget Salas  Procedure(s) Performed: Procedure(s) (LRB): ANTERIOR REPAIR (CYSTOCELE) with wide  local excision (N/A)  Anesthesia type: Spinal  Patient location: PACU  Post pain: Pain level controlled  Post assessment: Post-op Vital signs reviewed  Last Vitals:  Filed Vitals:   05/29/14 1515  BP: 91/43  Pulse: 50  Temp:   Resp: 15    Post vital signs: Reviewed  Level of consciousness: awake  Complications: No apparent anesthesia complications

## 2014-05-29 NOTE — Anesthesia Procedure Notes (Signed)
Spinal  Patient location during procedure: OR Start time: 05/29/2014 1:08 PM Staffing Anesthesiologist: Brayton CavesJACKSON, Dohn Stclair Performed by: anesthesiologist  Preanesthetic Checklist Completed: patient identified, site marked, surgical consent, pre-op evaluation, timeout performed, IV checked, risks and benefits discussed and monitors and equipment checked Spinal Block Patient position: sitting Prep: DuraPrep Patient monitoring: heart rate, cardiac monitor, continuous pulse ox and blood pressure Approach: midline Location: L3-4 Injection technique: single-shot Needle Needle type: Sprotte  Needle gauge: 24 G Needle length: 9 cm Assessment Sensory level: T4 Additional Notes Patient identified.  Risk benefits discussed including failed block, incomplete pain control, headache, nerve damage, paralysis, blood pressure changes, nausea, vomiting, reactions to medication both toxic or allergic, and postpartum back pain.  Patient expressed understanding and wished to proceed.  All questions were answered.  Sterile technique used throughout procedure and epidural site dressed with sterile barrier dressing. No paresthesia or other complications noted.The patient did not experience any signs of intravascular injection such as tinnitus or metallic taste in mouth nor signs of intrathecal spread such as rapid motor block. Please see nursing notes for vital signs.

## 2014-05-29 NOTE — Anesthesia Preprocedure Evaluation (Addendum)
Anesthesia Evaluation  Patient identified by MRN, date of birth, ID band Patient awake    Reviewed: Allergy & Precautions, H&P , Patient's Chart, lab work & pertinent test results, reviewed documented beta blocker date and time   History of Anesthesia Complications (+) history of anesthetic complications  Airway Mallampati: II TM Distance: >3 FB Neck ROM: full    Dental   Pulmonary  breath sounds clear to auscultation        Cardiovascular Exercise Tolerance: Good Rhythm:regular Rate:Normal     Neuro/Psych negative psych ROS   GI/Hepatic   Endo/Other    Renal/GU      Musculoskeletal   Abdominal   Peds  Hematology   Anesthesia Other Findings   Reproductive/Obstetrics                           Anesthesia Physical Anesthesia Plan  ASA: II  Anesthesia Plan: Spinal   Post-op Pain Management:    Induction:   Airway Management Planned:   Additional Equipment:   Intra-op Plan:   Post-operative Plan:   Informed Consent: I have reviewed the patients History and Physical, chart, labs and discussed the procedure including the risks, benefits and alternatives for the proposed anesthesia with the patient or authorized representative who has indicated his/her understanding and acceptance.   Dental Advisory Given  Plan Discussed with: CRNA, Surgeon and Anesthesiologist  Anesthesia Plan Comments:        Anesthesia Quick Evaluation

## 2014-05-29 NOTE — Interval H&P Note (Signed)
History and Physical Interval Note:  05/29/2014 12:51 PM  Bridget Salas  has presented today for surgery, with the diagnosis of Cystocele  The various methods of treatment have been discussed with the patient and family. After consideration of risks, benefits and other options for treatment, the patient has consented to  Procedure(s): ANTERIOR REPAIR (CYSTOCELE) (N/A) as a surgical intervention .  The patient's history has been reviewed, patient examined, no change in status, stable for surgery.  I have reviewed the patient's chart and labs.  Questions were answered to the patient's satisfaction.     Dion BodyVARNADO, Sierria Bruney

## 2014-05-29 NOTE — Discharge Instructions (Addendum)
Anterior and Posterior Colporrhaphy, Care After °Refer to this sheet in the next few weeks. These instructions provide you with information on caring for yourself after your procedure. Your health care provider may also give you more specific instructions. Your treatment has been planned according to current medical practices, but problems sometimes occur. Call your health care provider if you have any problems or questions after your procedure. °HOME CARE INSTRUCTIONS  °· Take frequent rest periods throughout the day.   °· Only take over-the-counter or prescription medicines as directed by your health care provider.   °· Avoid strenuous activity such as heavy lifting (more than 10 pounds [4.5 kg]), pushing, and pulling until your health care provider says it is okay.   °· Take showers if your health care provider approves. Pat incisions dry. Do not rub incisions with a washcloth or towel. Do not take tub baths until your health care provider approves.   °· Wear compression stockings as directed by your health care provider. These stockings help prevent blood clots from forming in your legs.   °· Talk with your health care provider about when you may return to work and your exercise routine.   °· Do not drive until your health care provider approves.   °· You may resume your normal diet. Eat a well-balanced diet.   °· Drink enough fluids to keep your urine clear or pale yellow.   °· Your normal bowel function should return. If you become constipated, you may:   °¨ Take a mild laxative.   °¨ Add fruit and bran to your diet.   °¨ Drink more fluids. °· Do not have sexual intercourse until permitted by your health care provider. °· Follow up with your health care provider as directed. °SEEK MEDICAL CARE IF: °You have persistent nausea or vomiting.  °SEEK IMMEDIATE MEDICAL CARE IF:  °· You have increased bleeding (more than a small spot) from the vaginal area.   °· Your pain is not relieved with medicine or becomes  worse.   °· You have redness, swelling, or increasing pain in the vaginal area.   °· You have abdominal pain.   °· You see pus coming from the wounds.   °· You develop a fever.   °· You have a foul smell coming from your vaginal area.   °· You develop light-headedness or you feel faint.   °· You have difficulty breathing.   °MAKE SURE YOU: °· Understand these instructions. °· Will watch your condition. °· Will get help right away if you are not doing well or get worse. °Document Released: 06/03/2005 Document Revised: 07/18/2013 Document Reviewed: 04/06/2013 °[employer]® Patient Information ©2015 [employer]. This information is not intended to replace advice given to you by your health care provider. Make sure you discuss any questions you have with your health care provider. ° °

## 2014-05-29 NOTE — Transfer of Care (Signed)
Immediate Anesthesia Transfer of Care Note  Patient: Bridget Salas  Procedure(s) Performed: Procedure(s): ANTERIOR REPAIR (CYSTOCELE) with wide  local excision (N/A)  Patient Location: PACU  Anesthesia Type:Spinal  Level of Consciousness: sedated  Airway & Oxygen Therapy: Patient Spontanous Breathing  Post-op Assessment: Report given to PACU RN  Post vital signs: Reviewed and stable  Complications: No apparent anesthesia complications

## 2014-05-30 ENCOUNTER — Encounter (HOSPITAL_COMMUNITY): Payer: Self-pay | Admitting: Obstetrics and Gynecology

## 2014-05-30 LAB — CBC
HEMATOCRIT: 33.8 % — AB (ref 36.0–46.0)
Hemoglobin: 11.4 g/dL — ABNORMAL LOW (ref 12.0–15.0)
MCH: 30.7 pg (ref 26.0–34.0)
MCHC: 33.7 g/dL (ref 30.0–36.0)
MCV: 91.1 fL (ref 78.0–100.0)
PLATELETS: 139 10*3/uL — AB (ref 150–400)
RBC: 3.71 MIL/uL — ABNORMAL LOW (ref 3.87–5.11)
RDW: 13.6 % (ref 11.5–15.5)
WBC: 11 10*3/uL — ABNORMAL HIGH (ref 4.0–10.5)

## 2014-05-30 MED ORDER — IBUPROFEN 800 MG PO TABS: 800.0000 mg | ORAL_TABLET | Freq: Three times a day (TID) | ORAL | Status: DC

## 2014-05-30 MED ORDER — OXYCODONE-ACETAMINOPHEN 5-325 MG PO TABS: 1.0000 | ORAL_TABLET | ORAL | Status: DC | PRN

## 2014-05-30 NOTE — Discharge Summary (Signed)
Physician Discharge Summary  Patient ID: Bridget ReelKaren L Wadel MRN: [id number] DOB/AGE: 59/01/1956 59 y.o.  Admit date: 05/29/2014 Discharge date: 05/30/2014  Admission Diagnoses:Grade 3 Cystocele, VIN 3   Discharge Diagnoses: S/p anterior repair, wide local excision   Discharged Condition: good  Hospital Course: Pt had spinal anesthesia.  Pain was 0/10 post op.  Vaginal packing placed and removed am after procedure.  Consults: None  Significant Diagnostic Studies: labs: post op Hg pending  Treatments: surgery: See above  Discharge Exam: Blood pressure 116/60, pulse 69, temperature 98 F (36.7 C), temperature source Oral, resp. rate 16, height 5\' 2"  (1.575 m), weight 83.915 kg (185 lb), SpO2 97.00%. Gen:  NAD Abdomen:  Soft, nontender Extremities:  No edema or calf tenderness  Disposition:   Discharge Instructions   Call MD for:  extreme fatigue    Complete by:  As directed      Call MD for:  persistant dizziness or light-headedness    Complete by:  As directed      Call MD for:  redness, tenderness, or signs of infection (pain, swelling, redness, odor or green/yellow discharge around incision site)    Complete by:  As directed      Call MD for:  severe uncontrolled pain    Complete by:  As directed      Diet - low sodium heart healthy    Complete by:  As directed      Discharge instructions    Complete by:  As directed   See discharge instructions.     Discharge wound care:    Complete by:  As directed   Keep wound clean and dry.  Can apply Neosporin TID.     Increase activity slowly    Complete by:  As directed      Sexual Activity Restrictions    Complete by:  As directed   Pelvic rest x 6 weeks.            Medication List         Fish Oil 300 MG Caps  Take 1 capsule by mouth daily.     ibuprofen 800 MG tablet  Commonly known as:  ADVIL,MOTRIN  Take 1 tablet (800 mg total) by mouth every 8 (eight) hours.     multivitamin with minerals tablet  Take 1 tablet  by mouth daily.     oxyCODONE-acetaminophen 5-325 MG per tablet  Commonly known as:  PERCOCET/ROXICET  Take 1-2 tablets by mouth every 4 (four) hours as needed for severe pain (moderate to severe pain (when tolerating fluids)).     SUMAtriptan 100 MG tablet  Commonly known as:  IMITREX  Take 100 mg by mouth every 2 (two) hours as needed for migraine or headache. May repeat in 2 hours if headache persists or recurs.           Follow-up Information   Follow up with Geryl RankinsVARNADO, Jaimere Feutz, MD In 2 weeks. (Post op check)    Specialty:  Obstetrics and Gynecology   Contact information:   [address], [address] [place] [address] [phone]       Signed: Geryl RankinsVARNADO, Nimco Bivens 05/30/2014, 5:26 AM

## 2014-05-30 NOTE — Progress Notes (Signed)
Pt is discharged in the care of husband with N>t> escort. Denies pain or discomfort. Spirits are good Discharge instructions with Rx were given to pt with good understanding Questions were asked and answered. No equipment neede for home.

## 2014-05-30 NOTE — Addendum Note (Signed)
Addendum created 05/30/14 [id number] by Gertie Feyynthia R Walker, CRNA   Modules edited: Notes Section   Notes Section:  File: [id number]

## 2014-05-30 NOTE — Progress Notes (Signed)
Vaginal packing removed as ordered, small amount dark vaginal drainage noted on packing.  Foley cath removed as ordered.  Patient tolerated well.

## 2014-05-30 NOTE — Op Note (Signed)
NAMHumphrey Rolls:  Treichler, Reganne               ACCOUNT NO.:  [id number]  MEDICAL RECORD NO.:  [id number]  LOCATION:  9320                          FACILITY:  [facility]  PHYSICIAN:  Pieter PartridgeEvelyn B Shown Dissinger, MD   DATE OF BIRTH:  1956-07-27  DATE OF PROCEDURE:  05/29/2014 DATE OF DISCHARGE:                              OPERATIVE REPORT   PREOPERATIVE DIAGNOSIS:  Cystocele and VIN3.  POSTOPERATIVE DIAGNOSIS:  Cystocele and VIN3.  PROCEDURES:  Anterior repair and wide local excision of vulva.  SURGEON:  Pieter PartridgeEvelyn B Kaceton Vieau, MD  PHYSICIAN ASSISTANT:  Dr. Charlotta Newtonzan and other assistant as technician.  ANESTHESIA:  Local, spinal IV sedation.  EBL:  25.  URINE:  500 mL at the beginning of the case.  BLOOD ADMINISTERED:  None.  DRAINS:  Foley catheter placed during the procedure.  LOCAL:  Marcaine with 1:200,000 epinephrine.  SPECIMEN:  VIN3 peritoneum.  Disposition of specimen is to Pathology.  DISPOSITION:  The patient's disposition is to PACU, hemodynamically stable.  COMPLICATIONS:  None.  FINDINGS:  Grade 3 to grade 4 cystocele with Valsalva, previously documented by biopsy a VIN3 lesion at 5 o'clock position on the left of the perineum, right below the introitus.  PROCEDURE IN DETAIL:  Ms. Duffy RhodyStanley was identified in the holding area. She was then taken to the operating room where she underwent spinal anesthesia without complication.  She was then given IV sedation, placed in the dorsal lithotomy position, and prepped and draped in normal sterile fashion.  A time-out was taken.  I and O catheterization of the bladder was performed with about 500 mL returned.  Vaginal cuff was then identified.  I initially put in a weighted speculum, however, with the prolapse, I was able to see the apex of the vaginal cuff, we removed that.  Two Allis clamps were used to grasp the vaginal cuff on the right and the left.  I then injected local anesthesia along the vaginal cuff and on the anterior vaginal mucosa.  A  transverse incision was then made with a scalpel.  Vaginal mucosa was then undermined with the Strully scissors.  A midline incision was made in the vaginal mucosa up to approximately 1.5 cm below the urethra.  Once this area was opened, the edges were grasped with the Allis clamps, and the fibers were then released with the Metzenbaum scissors and with the scalpel until the bladder was separated easily. On either side, there was minimal bleeding noted during the dissection, however, Bovie was needed on the left-hand side.  Sutures were then placed in the muscularis.  0 Vicryl pop-off were then used at the beginning and at the end.  I used figure-of-eight on the top.  I placed approximately 6 or 7 sutures.  I tied those down from the urethra down to the vaginal cuff.  I did place 1 stitch where there was a small opening.  Once those all were tied down, the bladder appeared well supported.  I then trimmed the vagina and the vaginal tissue.  That was tough and was not sent to Pathology.  I then reapproximated the vaginal mucosa with 3-0 Vicryl in a continuous fashion.  I was pleased with the  repair, and there was no excessive bleeding at the end.  Then turned my attention to the perineum.  After the Betadine prep, there was some difficulty seeing the demarcation of the lesion, but however, I cleaned it off with some wet sponge and dried it.  I then outlined the lesion, which was about 2 cm with the marking pen, and then I measured 1 cm in all directions.  Because of the shape of it, I almost did a J-like excision because 1 area back up to the introitus.  So, that area was marked 1 cm round.  I then took an 11 blade scalpel and traced my demarcation.  Once it was released circumferentially, I then grabbed the upper apex of the incision and shaved off the area from the subcutaneous tissue.  I reapproximated the subcutaneous space with 3-0 plain gut in a running fashion, the vertical  portion and then I ran another suture on the horizontal portion.  I then used 4-0 Monocryl to reapproximate the skin and then also put Dermabond.  I then inserted Estrace into the vagina 4 g, moistened vaginal packing was then placed into the vagina.  The Foley was then placed after I changed gloves and cleaned off, cleaned the urethral meatus with Betadine.  Once a Foley was placed, clear urine was noted.  The patient was under general anesthesia, but she metabolized the medications quickly.  Periodically, she was awake.  At one moment, she coughed during the procedure, but there was no injury to the bladder. Upon completing the procedure, the patient was awake and comfortable and had tolerated the procedure well.  All instrument, sponge, and needle counts were correct x3.  She had SCDs on during the entire case.  Ancef 2 g IV was given prior to the procedure.     Pieter PartridgeEvelyn B Haeli Gerlich, MD     EBV/MEDQ  D:  05/29/2014  T:  05/30/2014  Job:  [id number]

## 2014-05-30 NOTE — Anesthesia Postprocedure Evaluation (Signed)
  Anesthesia Post-op Note  Patient: Bridget Salas  Procedure(s) Performed: Procedure(s): ANTERIOR REPAIR (CYSTOCELE) with wide  local excision (N/A)  Patient Location: [facility]  Anesthesia Type:Spinal  Level of Consciousness: awake, alert  and oriented  Airway and Oxygen Therapy: Patient Spontanous Breathing  Post-op Pain: none  Post-op Assessment: Post-op Vital signs reviewed and Patient's Cardiovascular Status Stable  Post-op Vital Signs: Reviewed and stable  Last Vitals:  Filed Vitals:   05/30/14 0547  BP: 119/62  Pulse: 50  Temp: 36.4 C  Resp: 16    Complications: No apparent anesthesia complications

## 2014-08-28 NOTE — Op Note (Signed)
NAMCindie Laroche:  Butcher, Lynsey               ACCOUNT NO.:  [id number]  MEDICAL RECORD NO.:  [id number]  LOCATION:  9320                          FACILITY:  [facility]  PHYSICIAN:  Pieter PartridgeEvelyn B Dontreal Miera, MD   DATE OF BIRTH:  05-04-1956  DATE OF PROCEDURE:  05/29/2014 DATE OF DISCHARGE:  05/30/2014                              OPERATIVE REPORT   ADDENDUM:  Addendum is for the purpose of justification of assistance with a major surgery.  Due to the extensive nature of the patient's cystocele, assistance was needed.  Dr. Charlotta Newtonzan assisted with the repair of the cystocele.   The wide local excision was performed without assistance.  There were no residents involved in this case and not readily available for assistance.     Pieter PartridgeEvelyn B Akemi Overholser, MD     EBV/MEDQ  D:  08/27/2014  T:  08/27/2014  Job:  [id number]

## 2018-02-27 ENCOUNTER — Other Ambulatory Visit: Payer: Self-pay | Admitting: Podiatry

## 2018-02-27 ENCOUNTER — Ambulatory Visit (INDEPENDENT_AMBULATORY_CARE_PROVIDER_SITE_OTHER): Payer: BLUE CROSS/BLUE SHIELD

## 2018-02-27 ENCOUNTER — Encounter: Payer: Self-pay | Admitting: Podiatry

## 2018-02-27 ENCOUNTER — Ambulatory Visit: Payer: BLUE CROSS/BLUE SHIELD | Admitting: Podiatry

## 2018-02-27 VITALS — BP 130/79 | HR 53 | Resp 16

## 2018-02-27 DIAGNOSIS — M79672 Pain in left foot: Secondary | ICD-10-CM

## 2018-02-27 DIAGNOSIS — M722 Plantar fascial fibromatosis: Secondary | ICD-10-CM | POA: Diagnosis not present

## 2018-02-27 DIAGNOSIS — M779 Enthesopathy, unspecified: Secondary | ICD-10-CM

## 2018-02-27 MED ORDER — TRIAMCINOLONE ACETONIDE 10 MG/ML IJ SUSP
10.0000 mg | Freq: Once | INTRAMUSCULAR | Status: AC
  Administered 2018-02-27: 10 mg

## 2018-02-27 MED ORDER — DICLOFENAC SODIUM 75 MG PO TBEC: 75.0000 mg | DELAYED_RELEASE_TABLET | Freq: Two times a day (BID) | ORAL | 2 refills | Status: DC

## 2018-02-27 NOTE — Progress Notes (Signed)
Subjective:   Patient ID: Bridget Salas, female   DOB: 62 y.o.   MRN: [id number]   HPI Patient presents stating she has had around 3 months of pain in the bottom of the right heel and had this years ago and is developed some discomfort in the forefoot bilateral that is more like a nerve and is very occasional right over left.  Patient does not smoke and likes to be active   Review of Systems  All other systems reviewed and are negative.       Objective:  Physical Exam  Constitutional: She appears well-developed and well-nourished.  Cardiovascular: Intact distal pulses.  Pulmonary/Chest: Effort normal.  Musculoskeletal: Normal range of motion.  Neurological: She is alert.  Skin: Skin is warm.  Nursing note and vitals reviewed.   Neurovascular status intact muscle strength is adequate range of motion within normal limits with patient found to have exquisite tenderness in the plantar aspect of the right heel at the insertional point tendon calcaneus with minimal forefoot pain and upon testing I did not note any loss of nerve conduction for sharp dull vibratory.  Patient is found to have good digital perfusion is well oriented x3     Assessment:  Acute plantar fasciitis right with the possibility for compensatory irritation of the forefoot right over left     Plan:  H&P x-rays and condition discussed with patient.  At this time are focusing on the heel I injected the plantar fascia 3 mg Kenalog 5 mg Xylocaine applied fascial brace and gave instructions for physical therapy.  Based on diclofenac 75 mg twice daily and reappoint to recheck  X-ray indicates there is spur formation with no indication of stress fracture arthritis

## 2018-02-27 NOTE — Patient Instructions (Signed)

## 2018-03-13 ENCOUNTER — Ambulatory Visit: Payer: BLUE CROSS/BLUE SHIELD | Admitting: Podiatry

## 2018-03-16 ENCOUNTER — Ambulatory Visit: Payer: BLUE CROSS/BLUE SHIELD | Admitting: Podiatry

## 2018-03-16 ENCOUNTER — Encounter: Payer: Self-pay | Admitting: Podiatry

## 2018-03-16 DIAGNOSIS — M722 Plantar fascial fibromatosis: Secondary | ICD-10-CM

## 2018-03-16 MED ORDER — TRIAMCINOLONE ACETONIDE 10 MG/ML IJ SUSP
10.0000 mg | Freq: Once | INTRAMUSCULAR | Status: AC
  Administered 2018-03-16: 10 mg

## 2018-03-19 NOTE — Progress Notes (Signed)
Subjective:   Patient ID: Bridget ReelKaren L Pile, female   DOB: 62 y.o.   MRN: [id number]   HPI Patient states doing a little bit better but the pain is still bad when I get up in the morning and after periods of sitting   ROS      Objective:  Physical Exam  Neurovascular status intact with patient's right heel still showing inflammation discomfort but quite a bit improved from previous visit     Assessment:  Inflammatory fasciitis right with pain still present upon deep palpation     Plan:  Reinjected the plantar fascia right 3 mg Kenalog 5 mg Xylocaine and applied a night splint in order to provide for plantar support stretch and advised on aggressive ice therapy.  Reappoint 3 weeks or earlier if needed

## 2018-04-12 ENCOUNTER — Ambulatory Visit: Payer: BLUE CROSS/BLUE SHIELD | Admitting: Podiatry

## 2018-04-19 ENCOUNTER — Encounter: Payer: Self-pay | Admitting: Podiatry

## 2018-04-19 ENCOUNTER — Ambulatory Visit (INDEPENDENT_AMBULATORY_CARE_PROVIDER_SITE_OTHER): Payer: 59 | Admitting: Podiatry

## 2018-04-19 ENCOUNTER — Telehealth: Payer: Self-pay | Admitting: Podiatry

## 2018-04-19 DIAGNOSIS — M722 Plantar fascial fibromatosis: Secondary | ICD-10-CM

## 2018-04-19 MED ORDER — METHYLPREDNISOLONE 4 MG PO TBPK: ORAL_TABLET | ORAL | 0 refills | Status: DC

## 2018-04-19 MED ORDER — TRIAMCINOLONE ACETONIDE 10 MG/ML IJ SUSP
10.0000 mg | Freq: Once | INTRAMUSCULAR | Status: AC
  Administered 2018-04-19: 10 mg

## 2018-04-19 NOTE — Telephone Encounter (Signed)
I was there this afternoon and given an ankle boot. My question is, are my toes supposed to hang over the end of it? I was given a small but as I walk my first three toes are over the top. Can you tell me if that is right or do I need to do the suck it up like a buttercup thing. You can call me back at [phone]. Thank you.

## 2018-04-19 NOTE — Progress Notes (Signed)
Subjective:   Patient ID: Bridget Salas, female   DOB: 62 y.o.   MRN: [id number]   HPI Patient states she continues to have discomfort in the bottom of the right heel but it seems like it is moved somewhat to the outside and she feels like she has trouble walking on it   ROS      Objective:  Physical Exam  Neurovascular status intact with exquisite discomfort in the plantar lateral aspect of the right heel with the medial heel somewhat improved from previous     Assessment:  Plantar fasciitis that has moved and is probably compensatory in nature with discomfort across the entire bottom of the heel     Plan:  H&P condition reviewed and recommended continued injection therapy which was accomplished on the lateral side and due to the pain in the entire heel I did go ahead and immobilized with air fracture walker.  Patient will be seen back to recheck in 4 weeks or earlier if needed

## 2018-04-19 NOTE — Telephone Encounter (Signed)
I asked pt if she was sure she had her foot all the way back in the boot, and pt stated yes. I told pt to rest and to stay off the foot to decrease any chance of injury to the toes hanging out of the boot, and to come in tomorrow and I would help fit her with a boot. Pt states understanding.

## 2018-04-20 NOTE — Telephone Encounter (Signed)
Pt present for exchange of cam walker due to the initial cam walker was too short. I fitted pt with a small, tall cam walker.

## 2018-05-22 ENCOUNTER — Ambulatory Visit (INDEPENDENT_AMBULATORY_CARE_PROVIDER_SITE_OTHER): Payer: 59 | Admitting: Podiatry

## 2018-05-22 ENCOUNTER — Encounter: Payer: Self-pay | Admitting: Podiatry

## 2018-05-22 DIAGNOSIS — M722 Plantar fascial fibromatosis: Secondary | ICD-10-CM

## 2018-05-22 NOTE — Progress Notes (Signed)
Subjective:   Patient ID: Renaldo ReelKaren L Maney, female   DOB: 62 y.o.   MRN: [id number]   HPI Patient states today might foot feels great but yesterday really hurt and is been hurting most days.  He continues to hurt more on the outside of the heel towards the Achilles but on the bottom of the heel and is very tender when she first steps on it   ROS      Objective:  Physical Exam  Neurovascular status intact muscle strength is adequate with patient's pain being mostly in the plantar lateral aspect of the right heel with inflammation fluid noted at this juncture.  Patient also has moderate discomfort in the mid plantar fascial band also note     Assessment:  Plantar lateral fasciitis right with the medial side doing pretty good at the current time     Plan:  H&P condition reviewed and discussed shockwave therapy.  Patient had had previous shockwave therapy that we have done years ago which is very successful medial pain and wants to pursue this option and we will do the plantar lateral surface of the calcaneus and the Achilles tendon calf muscle.  Patient is scheduled and just was made aware of what we will do with this patient

## 2018-05-23 ENCOUNTER — Ambulatory Visit: Payer: 59

## 2018-05-23 DIAGNOSIS — B351 Tinea unguium: Secondary | ICD-10-CM

## 2018-05-23 DIAGNOSIS — M722 Plantar fascial fibromatosis: Secondary | ICD-10-CM

## 2018-05-24 NOTE — Progress Notes (Signed)
Patient presents with pain in her right heel that she states has been present on and off for several months. She said she has tried various treatments with no success.   Minimal pain when palpated in medial to lateral side of heel   ESWT administered to right heel for 4 joules and tolerated well. Advised to avoid scheduled NSAIDs and ice and utilize boot if possible for 2-3 days post treatment. Follow up in 2 weeks for 2nd treatment

## 2018-06-05 ENCOUNTER — Ambulatory Visit: Payer: Self-pay

## 2018-06-05 DIAGNOSIS — M79676 Pain in unspecified toe(s): Secondary | ICD-10-CM

## 2018-06-05 DIAGNOSIS — M722 Plantar fascial fibromatosis: Secondary | ICD-10-CM

## 2018-06-09 NOTE — Progress Notes (Signed)
Patient presents with pain in her right heel that she states has been present on and off for several months. She said she has tried various treatments with no success. She is still having some pain but she states that she has noticed an improvement.   Minimal pain when palpated in medial to lateral side of heel   ESWT administered to right heel for 6 joules and tolerated well. Advised to avoid scheduled NSAIDs and ice and utilize boot if possible for 2-3 days post treatment. Follow up in 2 weeks for 3rd treatment

## 2018-06-12 ENCOUNTER — Ambulatory Visit: Payer: 59

## 2018-06-12 DIAGNOSIS — M722 Plantar fascial fibromatosis: Secondary | ICD-10-CM

## 2018-06-12 DIAGNOSIS — M79676 Pain in unspecified toe(s): Secondary | ICD-10-CM

## 2018-06-15 NOTE — Progress Notes (Signed)
Patient presents with pain in her right heel that she states has been present on and off for several months. She said she has tried various treatments with no success. She says the pain has improved a lot.   Minimal pain when palpated in medial to lateral side of heel   ESWT administered to right heel for 6 joules and tolerated well. Advised to avoid scheduled NSAIDs and ice and utilize boot if possible for 2-3 days post treatment. Follow up in 2 weeks for 3rd treatment

## 2018-06-26 ENCOUNTER — Ambulatory Visit (INDEPENDENT_AMBULATORY_CARE_PROVIDER_SITE_OTHER): Payer: Self-pay

## 2018-06-26 ENCOUNTER — Other Ambulatory Visit: Payer: 59

## 2018-06-26 DIAGNOSIS — M79676 Pain in unspecified toe(s): Secondary | ICD-10-CM

## 2018-06-26 DIAGNOSIS — M722 Plantar fascial fibromatosis: Secondary | ICD-10-CM

## 2018-06-28 NOTE — Progress Notes (Signed)
Patient presents with pain in her right heel that she states has been present on and off for several months. She said she has tried various treatments with no success. She says the pain has improved a lot.  She all her foot feels a lot better she is not having any more pain.  Minimal pain when palpated in medial to lateral side of heel   ESWT administered to right heel for 9.7 joules and tolerated well. Advised to avoid scheduled NSAIDs and ice and utilize boot if possible for 2-3 days post treatment.  Follow-up in 4 weeks if needed if symptoms do not improve or worsen.

## 2018-10-31 DIAGNOSIS — R198 Other specified symptoms and signs involving the digestive system and abdomen: Secondary | ICD-10-CM | POA: Insufficient documentation

## 2018-10-31 DIAGNOSIS — R0989 Other specified symptoms and signs involving the circulatory and respiratory systems: Secondary | ICD-10-CM | POA: Insufficient documentation

## 2018-10-31 DIAGNOSIS — K219 Gastro-esophageal reflux disease without esophagitis: Secondary | ICD-10-CM | POA: Insufficient documentation

## 2018-11-14 ENCOUNTER — Other Ambulatory Visit: Payer: Self-pay | Admitting: Otolaryngology

## 2018-11-14 DIAGNOSIS — R1314 Dysphagia, pharyngoesophageal phase: Secondary | ICD-10-CM

## 2018-11-27 ENCOUNTER — Ambulatory Visit
Admission: RE | Admit: 2018-11-27 | Discharge: 2018-11-27 | Disposition: A | Discharge status: Home / Self Care | Payer: Managed Care, Other (non HMO) | Source: Ambulatory Visit | Attending: Otolaryngology | Admitting: Otolaryngology

## 2018-11-27 DIAGNOSIS — R1314 Dysphagia, pharyngoesophageal phase: Secondary | ICD-10-CM

## 2019-03-05 ENCOUNTER — Encounter: Payer: Self-pay | Admitting: Podiatry

## 2019-03-05 ENCOUNTER — Ambulatory Visit (INDEPENDENT_AMBULATORY_CARE_PROVIDER_SITE_OTHER): Payer: 59 | Admitting: Podiatry

## 2019-03-05 ENCOUNTER — Ambulatory Visit: Payer: 59

## 2019-03-05 ENCOUNTER — Other Ambulatory Visit: Payer: Self-pay

## 2019-03-05 DIAGNOSIS — M722 Plantar fascial fibromatosis: Secondary | ICD-10-CM

## 2019-03-05 MED ORDER — TRIAMCINOLONE ACETONIDE 10 MG/ML IJ SUSP
10.0000 mg | Freq: Once | INTRAMUSCULAR | Status: AC
  Administered 2019-03-05: 10 mg

## 2019-03-08 NOTE — Progress Notes (Signed)
Subjective:   Patient ID: Bridget Salas, female   DOB: 63 y.o.   MRN: [id number]   HPI Patient states she was doing well but over the last few weeks has had a significant flareup of pain in the right plantar heel   ROS      Objective:  Physical Exam  Neurovascular status intact with inflammation pain right plantar heel with intense discomfort upon palpation     Assessment:  Acute plantar fasciitis right with inflammation fluid buildup     Plan:  H&P discussed condition and at this point reviewed x-ray and injected the plantar fascial 3 mg Kenalog 5 g Xylocaine advised on physical therapy anti-inflammatory reappoint to recheck  X-ray indicated spur with no indications of stress fracture arthritis

## 2019-03-13 ENCOUNTER — Telehealth: Payer: Self-pay | Admitting: *Deleted

## 2019-03-13 NOTE — Telephone Encounter (Signed)
Pt states she has an appt 03/19/2019 with Dr. Charlsie Merles and had received an injection the week before that only helped a day or so, would like to know if she could proceed to the shockwave if he felt that was the direction he would proceed since on th other foot after 3-4 treatments she had improvement.

## 2019-03-14 NOTE — Telephone Encounter (Signed)
Should start the shockwave now

## 2019-03-19 ENCOUNTER — Other Ambulatory Visit: Payer: Self-pay

## 2019-03-19 ENCOUNTER — Ambulatory Visit (INDEPENDENT_AMBULATORY_CARE_PROVIDER_SITE_OTHER): Payer: 59 | Admitting: Podiatry

## 2019-03-19 ENCOUNTER — Encounter: Payer: Self-pay | Admitting: Podiatry

## 2019-03-19 VITALS — Temp 97.2°F

## 2019-03-19 DIAGNOSIS — M722 Plantar fascial fibromatosis: Secondary | ICD-10-CM | POA: Diagnosis not present

## 2019-03-19 MED ORDER — TRIAMCINOLONE ACETONIDE 10 MG/ML IJ SUSP
10.0000 mg | Freq: Once | INTRAMUSCULAR | Status: AC
  Administered 2019-03-19: 11:00:00 10 mg

## 2019-03-19 NOTE — Progress Notes (Signed)
Subjective:   Patient ID: Bridget Salas, female   DOB: 63 y.o.   MRN: [id number]   HPI Patient states that heel has really been hurting still and is hard for her to walk on it in the boot that she had from previous is not fitting well and it no longer holds her foot appropriately   ROS      Objective:  Physical Exam  Neurovascular status intact with patient's right heel still sore slightly distal to where it was last time but quite a bit inflamed in the medial band     Assessment:  Continued acute plantar fasciitis right     Plan:  H&P condition reviewed and today I did a careful more distal injection 3 mg Dexasone Kenalog 5 mg Xylocaine and then applied a new air fracture walker to give her stress release off the heel.  Reappoint to recheck in 3 weeks

## 2019-04-02 ENCOUNTER — Ambulatory Visit: Payer: 59 | Admitting: Podiatry

## 2019-04-09 ENCOUNTER — Ambulatory Visit (INDEPENDENT_AMBULATORY_CARE_PROVIDER_SITE_OTHER): Payer: 59 | Admitting: Podiatry

## 2019-04-09 ENCOUNTER — Encounter: Payer: Self-pay | Admitting: Podiatry

## 2019-04-09 ENCOUNTER — Other Ambulatory Visit: Payer: Self-pay

## 2019-04-09 VITALS — Temp 97.5°F

## 2019-04-09 DIAGNOSIS — M722 Plantar fascial fibromatosis: Secondary | ICD-10-CM | POA: Diagnosis not present

## 2019-04-09 NOTE — Progress Notes (Signed)
Subjective:   Patient ID: Bridget Salas, female   DOB: 63 y.o.   MRN: [id number]   HPI Patient presents stating the heel is still really hurting her and seemed to do the best after she has had the shockwave done with pain improved with the immobilization but still present   ROS      Objective:  Physical Exam  Neuro vascular status unchanged with patient found to have continued discomfort in the plantar heel right at the insertion of the tendon into the calcaneus with inflammation fluid buildup noted     Assessment:  Continued plantar fasciitis right with inflammation fluid noted     Plan:  Discussed condition and recommended 3 more shockwave therapy to the medial side of the right plantar heel.  Patient wants this treatment and I explained procedures and risk and patient wants to have this done and I went ahead and I scheduled her for shockwave therapy with technician.  Patient will wear the boot full-time and will use it during the recovery period for shockwave

## 2019-04-10 ENCOUNTER — Ambulatory Visit (INDEPENDENT_AMBULATORY_CARE_PROVIDER_SITE_OTHER): Payer: 59

## 2019-04-10 ENCOUNTER — Other Ambulatory Visit: Payer: Self-pay

## 2019-04-10 DIAGNOSIS — M722 Plantar fascial fibromatosis: Secondary | ICD-10-CM

## 2019-04-10 DIAGNOSIS — M79676 Pain in unspecified toe(s): Secondary | ICD-10-CM

## 2019-04-12 NOTE — Progress Notes (Signed)
Patient is here today with complaint heel pain, this is been ongoing for several months, she is tried injections, anti-inflammatories, icing, stretching, and bracing.  Her pain continues, recently diagnosed with plantar fasciitis right foot.  Pain on palpation to the side and back of the heel of the right foot.  ESWT administered for 7.3 J and tolerated well, to follow-up next week for her second treatment.  We will do total of 3 treatments.  Advised her to avoid NSAIDs and ice.

## 2019-04-17 ENCOUNTER — Other Ambulatory Visit (INDEPENDENT_AMBULATORY_CARE_PROVIDER_SITE_OTHER): Payer: Self-pay

## 2019-04-17 ENCOUNTER — Other Ambulatory Visit: Payer: Self-pay

## 2019-04-17 DIAGNOSIS — M79676 Pain in unspecified toe(s): Secondary | ICD-10-CM

## 2019-04-24 ENCOUNTER — Ambulatory Visit (INDEPENDENT_AMBULATORY_CARE_PROVIDER_SITE_OTHER): Payer: 59

## 2019-04-24 ENCOUNTER — Other Ambulatory Visit: Payer: Self-pay

## 2019-04-24 DIAGNOSIS — M722 Plantar fascial fibromatosis: Secondary | ICD-10-CM

## 2019-04-24 DIAGNOSIS — M79676 Pain in unspecified toe(s): Secondary | ICD-10-CM

## 2019-04-25 NOTE — Progress Notes (Signed)
Patient is here today with complaint heel pain, this is been ongoing for several months, she is tried injections, anti-inflammatories, icing, stretching, and bracing.  Her pain continues, recently diagnosed with plantar fasciitis right foot.  Pain on palpation to the side and back of the heel of the right foot.  ESWT administered for 9 J and tolerated well, to follow-up as needed for additional treatment if pain does not improve.

## 2019-05-11 ENCOUNTER — Other Ambulatory Visit: Payer: Self-pay

## 2019-05-11 ENCOUNTER — Ambulatory Visit: Payer: 59

## 2019-05-11 DIAGNOSIS — M79676 Pain in unspecified toe(s): Secondary | ICD-10-CM

## 2019-05-11 DIAGNOSIS — M722 Plantar fascial fibromatosis: Secondary | ICD-10-CM

## 2019-05-14 NOTE — Progress Notes (Signed)
Patient is here today with complaint heel pain, this is been ongoing for several months, she is tried injections, anti-inflammatories, icing, stretching, and bracing.  Her pain continues, recently diagnosed with plantar fasciitis right foot.  Pain on palpation to the side and back of the heel of the right foot.  ESWT administered for 8 J and tolerated well, to follow-up in 2 weeks for 5th treatment

## 2019-05-31 ENCOUNTER — Other Ambulatory Visit: Payer: Self-pay

## 2019-05-31 ENCOUNTER — Ambulatory Visit (INDEPENDENT_AMBULATORY_CARE_PROVIDER_SITE_OTHER): Payer: 59 | Admitting: Podiatry

## 2019-05-31 ENCOUNTER — Encounter: Payer: Self-pay | Admitting: Podiatry

## 2019-05-31 DIAGNOSIS — M722 Plantar fascial fibromatosis: Secondary | ICD-10-CM | POA: Diagnosis not present

## 2019-05-31 NOTE — Patient Instructions (Signed)
Pre-Operative Instructions  Congratulations, you have decided to take an important step towards improving your quality of life.  You can be assured that the doctors and staff at [facility] will be with you every step of the way.  Here are some important things you should know:  1. Plan to be at the surgery center/hospital at least 1 (one) hour prior to your scheduled time, unless otherwise directed by the surgical center/hospital staff.  You must have a responsible adult accompany you, remain during the surgery and drive you home.  Make sure you have directions to the surgical center/hospital to ensure you arrive on time. 2. If you are having surgery at [facility] or [facility] hospitals, you will need a copy of your medical history and physical form from your family physician within one month prior to the date of surgery. We will give you a form for your primary physician to complete.  3. We make every effort to accommodate the date you request for surgery.  However, there are times where surgery dates or times have to be moved.  We will contact you as soon as possible if a change in schedule is required.   4. No aspirin/ibuprofen for one week before surgery.  If you are on aspirin, any non-steroidal anti-inflammatory medications (Mobic, Aleve, Ibuprofen) should not be taken seven (7) days prior to your surgery.  You make take Tylenol for pain prior to surgery.  5. Medications - If you are taking daily heart and blood pressure medications, seizure, reflux, allergy, asthma, anxiety, pain or diabetes medications, make sure you notify the surgery center/hospital before the day of surgery so they can tell you which medications you should take or avoid the day of surgery. 6. No food or drink after midnight the night before surgery unless directed otherwise by surgical center/hospital staff. 7. No alcoholic beverages 24-hours prior to surgery.  No smoking 24-hours prior or 24-hours after  surgery. 8. Wear loose pants or shorts. They should be loose enough to fit over bandages, boots, and casts. 9. Don't wear slip-on shoes. Sneakers are preferred. 10. Bring your boot with you to the surgery center/hospital.  Also bring crutches or a walker if your physician has prescribed it for you.  If you do not have this equipment, it will be provided for you after surgery. 11. If you have not been contacted by the surgery center/hospital by the day before your surgery, call to confirm the date and time of your surgery. 12. Leave-time from work may vary depending on the type of surgery you have.  Appropriate arrangements should be made prior to surgery with your employer. 13. Prescriptions will be provided immediately following surgery by your doctor.  Fill these as soon as possible after surgery and take the medication as directed. Pain medications will not be refilled on weekends and must be approved by the doctor. 14. Remove nail polish on the operative foot and avoid getting pedicures prior to surgery. 15. Wash the night before surgery.  The night before surgery wash the foot and leg well with water and the antibacterial soap provided. Be sure to pay special attention to beneath the toenails and in between the toes.  Wash for at least three (3) minutes. Rinse thoroughly with water and dry well with a towel.  Perform this wash unless told not to do so by your physician.  Enclosed: 1 Ice pack (please put in freezer the night before surgery)   1 Hibiclens skin cleaner     Pre-op instructions  If you have any questions regarding the instructions, please do not hesitate to call our office.  [place]: [address], [place], [place] [address] -- [phone]  [place]: [address] [place], [place] [address] -- [phone]  [place]: [address]  [place], [place] [address] -- [phone]  [place]: [address], [address], [place], [place] [address] -- [phone]  Website:  [email or web address] 

## 2019-06-04 ENCOUNTER — Ambulatory Visit: Payer: 59 | Admitting: Podiatry

## 2019-06-04 NOTE — Progress Notes (Signed)
Subjective:   Patient ID: Bridget Salas, female   DOB: 63 y.o.   MRN: [id number]   HPI Patient states her heels absolutely killing her and the shockwave has not made it better   ROS      Objective:  Physical Exam  Neurovascular status intact with patient found to have exquisite discomfort in the plantar heel right at the insertional point tendon calcaneus with inflammation fluid of the medial band     Assessment:  Cute fasciitis symptoms right with inflammation fluid buildup     Plan:  Reviewed at great length and due to failure to respond to numerous conservative care and exquisite discomfort medial band surgical intervention is been recommended.  Patient wants surgery and at this point I allowed her to go over surgical consent form alternative treatments and complications.  Patient is willing to accept risk and at this point patient signed consent form after extensive review and is scheduled for outpatient surgery understanding that it may not cure problem and that large lateral foot pain or other pains may occur secondary to the procedure.  Understands total recovery will take 6 months to 1 year signed visit

## 2019-06-06 ENCOUNTER — Telehealth: Payer: Self-pay | Admitting: *Deleted

## 2019-06-06 NOTE — Telephone Encounter (Signed)
"  I am scheduled for surgery on July 14.  I have registered online and I still don't have a time yet."  Someone from the surgical center will give you a call on the Friday or Monday prior to your surgery date and give you your time.  "I am trying to arrange for someone to bring me."  You can give them a call at the surgery center.  Their phone number is [phone] and it's located on the back of the brochure that we gave you.  "Okay, I'll give them a call."

## 2019-06-06 NOTE — Telephone Encounter (Signed)
DOS 06/12/2019; 89381 - ENDOSCOPIC PLANTAR FASCIOTOMY RIGHT FOOT  [place]: 03/29/2019   Deductible:$5000 / met $87.50 Co-insurance:50% Co-pay:$0 Out of Pocket: $5000  This is a short term medical plan.   There is a pre-existing clause, she cannot have been treated for the condition 3 yrs prior to the effective date of 03/29/2019.  In order to get authorization, we request medical records from all attending physicians who have treated her for this condition.  Joint replacements, including tendons, ligaments, and cartilage are not considered eligible.

## 2019-06-06 NOTE — Telephone Encounter (Signed)
I am calling to inform you about your insurance.  "You mean the insurance company that I just got off the phone with."  Yes, they have a pre-existing clause.  "Well Dr. Paulla Dolly said it's not Plantar Fasciitis.  He said it too far on the heel for it to be Plantar Fasciitis."  Well there's a clause in your insurance as well that says Cartilage, ligaments, and tendons are not eligible.  You have a $5000 deductible and once that is met, they will only cover 50%.  "Yes, I know I was aware of that.  I really hate my insurance.  I was told that I made too much to have Medicare.  How much will it be if I do self pay, which is what it seems like I'm going to have to do?"  I'll have to get Jocelyn Lamer in our insurance department to call you back and give you that information.  You have to call the surgical center to get their estimate.  I've already gotten theirs and I'll call the anesthesia company tomorrow as well."

## 2019-06-12 ENCOUNTER — Encounter: Payer: Self-pay | Admitting: Podiatry

## 2019-06-12 DIAGNOSIS — M722 Plantar fascial fibromatosis: Secondary | ICD-10-CM | POA: Diagnosis not present

## 2019-06-18 ENCOUNTER — Ambulatory Visit (INDEPENDENT_AMBULATORY_CARE_PROVIDER_SITE_OTHER): Payer: 59 | Admitting: Podiatry

## 2019-06-18 ENCOUNTER — Encounter: Payer: Self-pay | Admitting: Podiatry

## 2019-06-18 ENCOUNTER — Other Ambulatory Visit: Payer: Self-pay

## 2019-06-18 VITALS — Temp 97.9°F

## 2019-06-18 DIAGNOSIS — M722 Plantar fascial fibromatosis: Secondary | ICD-10-CM

## 2019-06-19 NOTE — Progress Notes (Signed)
Subjective:   Patient ID: Bridget Salas, female   DOB: 63 y.o.   MRN: [id number]   HPI Patient states doing very well with surgery and very pleased so far   ROS      Objective:  Physical Exam  Neurovascular status intact negative Homans sign noted with incision sites intact medial lateral side right heel     Assessment:  Doing well post endoscopic surgery right     Plan:  H&P reviewed condition sterile dressing reapplied continue boot usage continue immobilization reappoint 2 weeks for stitch removal or earlier if needed

## 2019-07-02 ENCOUNTER — Encounter: Payer: Self-pay | Admitting: Podiatry

## 2019-07-02 ENCOUNTER — Ambulatory Visit (INDEPENDENT_AMBULATORY_CARE_PROVIDER_SITE_OTHER): Payer: 59 | Admitting: Podiatry

## 2019-07-02 ENCOUNTER — Other Ambulatory Visit: Payer: Self-pay

## 2019-07-02 VITALS — Temp 97.5°F

## 2019-07-02 DIAGNOSIS — M722 Plantar fascial fibromatosis: Secondary | ICD-10-CM | POA: Diagnosis not present

## 2019-07-04 NOTE — Progress Notes (Signed)
Subjective:   Patient ID: Bridget Salas, female   DOB: 63 y.o.   MRN: [id number]   HPI Patient states overall doing very well stating the arch is a little sore but very pleased    ROS      Objective:  Physical Exam  Neurovascular status intact with patient found to have excellent healing of the incision site with wound edges well coapted and minimal discomfort on plantar pressure to the area     Assessment:  Doing well post endoscopic surgery     Plan:  Stitches are removed and sterile dressing applied gave instructions to continue boot usage gradual increase in activity and shoe gear usage over the next 4 to 8 weeks.  Reappoint to recheck

## 2020-01-31 DIAGNOSIS — M25511 Pain in right shoulder: Secondary | ICD-10-CM | POA: Diagnosis not present

## 2020-04-04 DIAGNOSIS — B029 Zoster without complications: Secondary | ICD-10-CM | POA: Diagnosis not present

## 2020-04-04 DIAGNOSIS — M792 Neuralgia and neuritis, unspecified: Secondary | ICD-10-CM | POA: Diagnosis not present

## 2020-04-24 DIAGNOSIS — Z20828 Contact with and (suspected) exposure to other viral communicable diseases: Secondary | ICD-10-CM | POA: Diagnosis not present

## 2020-10-15 DIAGNOSIS — Z20822 Contact with and (suspected) exposure to covid-19: Secondary | ICD-10-CM | POA: Diagnosis not present

## 2020-10-17 ENCOUNTER — Telehealth (HOSPITAL_COMMUNITY): Payer: Self-pay

## 2020-10-17 NOTE — Telephone Encounter (Signed)
Called to discuss with patient about Covid symptoms and the use of the monoclonal antibody infusion for those with mild to moderate Covid symptoms and at a high risk of hospitalization.     Pt appears to qualify for this infusion due to co-morbid conditions and/or a member of an at-risk group in accordance with the FDA Emergency Use Authorization.   Risk factors include: BMI >25  Symptom onset: 10/13/20  Tested positive for COVID 19: 10/15/20 at [facility]  Discussed information regarding costs of monoclonal antibody treatment, given both CPT & REV codes, and encouraged patient to call their health insurance company to verify cost of treatment that pt will be financially responsible for.  Pt states that she would like to call her primary care provider prior to deciding if she would like infusion.  Pt given COVID hotline number [phone] to call and leave her information on regarding if she would like treatment or not; also given her last day of 10 day window to receive antibody infusion.

## 2020-10-18 ENCOUNTER — Telehealth: Payer: Self-pay | Admitting: Adult Health

## 2020-10-18 ENCOUNTER — Telehealth: Payer: Self-pay | Admitting: Unknown Physician Specialty

## 2020-10-18 ENCOUNTER — Other Ambulatory Visit: Payer: Self-pay | Admitting: Unknown Physician Specialty

## 2020-10-18 DIAGNOSIS — U071 COVID-19: Secondary | ICD-10-CM

## 2020-10-18 DIAGNOSIS — E663 Overweight: Secondary | ICD-10-CM

## 2020-10-18 NOTE — Telephone Encounter (Signed)
I connected by phone with Bridget Salas on 10/18/2020 at 3:33 PM to discuss the potential use of a new treatment for mild to moderate COVID-19 viral infection in non-hospitalized patients.  This patient is a 64 y.o. female that meets the FDA criteria for Emergency Use Authorization of COVID monoclonal antibody casirivimab/imdevimab, bamlanivimab/eteseviamb, or sotrovimab.  Has a (+) direct SARS-CoV-2 viral test result  Has mild or moderate COVID-19   Is NOT hospitalized due to COVID-19  Is within 10 days of symptom onset  Has at least one of the high risk factor(s) for progression to severe COVID-19 and/or hospitalization as defined in EUA.  Specific high risk criteria : Older age (>/= 65 yo) and BMI > 25   I have spoken and communicated the following to the patient or parent/caregiver regarding COVID monoclonal antibody treatment:  1. FDA has authorized the emergency use for the treatment of mild to moderate COVID-19 in adults and pediatric patients with positive results of direct SARS-CoV-2 viral testing who are 72 years of age and older weighing at least 40 kg, and who are at high risk for progressing to severe COVID-19 and/or hospitalization.  2. The significant known and potential risks and benefits of COVID monoclonal antibody, and the extent to which such potential risks and benefits are unknown.  3. Information on available alternative treatments and the risks and benefits of those alternatives, including clinical trials.  4. Patients treated with COVID monoclonal antibody should continue to self-isolate and use infection control measures (e.g., wear mask, isolate, social distance, avoid sharing personal items, clean and disinfect high touch surfaces, and frequent handwashing) according to CDC guidelines.   5. The patient or parent/caregiver has the option to accept or refuse COVID monoclonal antibody treatment.  After reviewing this information with the patient, the patient  has agreed to receive one of the available covid 19 monoclonal antibodies and will be provided an appropriate fact sheet prior to infusion. [name], NP 10/18/2020 3:33 PM  Sx onset 11/16

## 2020-10-18 NOTE — Telephone Encounter (Signed)
Called and LMOM regarding monoclonal antibody treatment for COVID 19 given to those who are at risk for complications and/or hospitalization of the virus.  Patient meets criteria based on: age and BMI greater than 25  Call back number given: [phone]  My chart message: sent  Lillard Anes, NP

## 2020-10-20 ENCOUNTER — Ambulatory Visit (HOSPITAL_COMMUNITY)
Admission: RE | Admit: 2020-10-20 | Discharge: 2020-10-20 | Disposition: A | Discharge status: Home / Self Care | Payer: Medicare HMO | Source: Ambulatory Visit | Attending: Pulmonary Disease | Admitting: Pulmonary Disease

## 2020-10-20 ENCOUNTER — Other Ambulatory Visit (HOSPITAL_COMMUNITY): Payer: Self-pay

## 2020-10-20 DIAGNOSIS — Z23 Encounter for immunization: Secondary | ICD-10-CM | POA: Insufficient documentation

## 2020-10-20 DIAGNOSIS — U071 COVID-19: Secondary | ICD-10-CM

## 2020-10-20 DIAGNOSIS — E663 Overweight: Secondary | ICD-10-CM

## 2020-10-20 MED ORDER — FAMOTIDINE IN NACL 20-0.9 MG/50ML-% IV SOLN: 20.0000 mg | Freq: Once | INTRAVENOUS | Status: DC | PRN

## 2020-10-20 MED ORDER — SODIUM CHLORIDE 0.9 % IV SOLN: INTRAVENOUS | Status: DC | PRN

## 2020-10-20 MED ORDER — DIPHENHYDRAMINE HCL 50 MG/ML IJ SOLN: 50.0000 mg | Freq: Once | INTRAMUSCULAR | Status: DC | PRN

## 2020-10-20 MED ORDER — METHYLPREDNISOLONE SODIUM SUCC 125 MG IJ SOLR: 125.0000 mg | Freq: Once | INTRAMUSCULAR | Status: DC | PRN

## 2020-10-20 MED ORDER — ALBUTEROL SULFATE HFA 108 (90 BASE) MCG/ACT IN AERS: 2.0000 | INHALATION_SPRAY | Freq: Once | RESPIRATORY_TRACT | Status: DC | PRN

## 2020-10-20 MED ORDER — SOTROVIMAB 500 MG/8ML IV SOLN
500.0000 mg | Freq: Once | INTRAVENOUS | Status: AC
  Administered 2020-10-20: 500 mg via INTRAVENOUS

## 2020-10-20 MED ORDER — EPINEPHRINE 0.3 MG/0.3ML IJ SOAJ: 0.3000 mg | Freq: Once | INTRAMUSCULAR | Status: DC | PRN

## 2020-10-20 NOTE — Progress Notes (Signed)
Patient reviewed Fact Sheet for Patients, Parents, and Caregivers for Emergency Use Authorization (EUA) of Sotrovimab for the Treatment of Coronavirus. Patient also reviewed and is agreeable to the estimated cost of treatment. Patient is agreeable to proceed.   

## 2020-10-20 NOTE — Discharge Instructions (Signed)

## 2020-10-21 ENCOUNTER — Telehealth: Payer: Self-pay | Admitting: Nurse Practitioner

## 2020-10-21 DIAGNOSIS — A0839 Other viral enteritis: Secondary | ICD-10-CM

## 2020-10-21 DIAGNOSIS — U071 COVID-19: Secondary | ICD-10-CM

## 2020-10-21 DIAGNOSIS — R110 Nausea: Secondary | ICD-10-CM

## 2020-10-21 MED ORDER — ONDANSETRON HCL 8 MG PO TABS: 8.0000 mg | ORAL_TABLET | Freq: Three times a day (TID) | ORAL | 1 refills | Status: DC | PRN

## 2020-10-21 NOTE — Telephone Encounter (Signed)
Patient called on call provider for [facility] [facility] infusion center to report symptoms of nausea and decreasing appetite that started yesterday evening after receiving the infusion at 10:30 yesterday morning.   Patient reports that she does not take prescription medications regularly, but does take vitamin supplements on a daily basis.   She also reports that when she first tested positive she started hydroxychloroquine 200mg  BID for treatment. She finished her last dose this morning. She reports that she was experiencing some diarrhea believed to be from the medication prior to yesterday evening/this morning, but now her diarrhea has become more frequent.  She denies any other symptoms at this time.    1. COVID-19 2. Nausea 3. Diarrhea due to COVID-19 Discussed with patient that nausea can be a side effect of the infusion, but is not typically this delayed. At this time it is unclear if the symptoms presented are due to worsening COVID-19 virus, mAb infusion related, or possibly related to hydroxychloroquine use for one week.   Prescription for zofran sent to patients pharmacy with recommendations for increased hydration and rest. Patient instructed that she may take immodium or kaopectate for diarrhea, as well.  Patient instructed to contact the on call provider if her symptoms persist or worsen.   - ondansetron (ZOFRAN) 8 MG tablet; Take 1 tablet (8 mg total) by mouth every 8 (eight) hours as needed for nausea or vomiting.  Dispense: 20 tablet; Refill: 1

## 2020-11-27 DIAGNOSIS — M1711 Unilateral primary osteoarthritis, right knee: Secondary | ICD-10-CM | POA: Diagnosis not present

## 2020-12-31 DIAGNOSIS — Z8544 Personal history of malignant neoplasm of other female genital organs: Secondary | ICD-10-CM | POA: Diagnosis not present

## 2020-12-31 DIAGNOSIS — K644 Residual hemorrhoidal skin tags: Secondary | ICD-10-CM | POA: Diagnosis not present

## 2021-01-01 ENCOUNTER — Other Ambulatory Visit: Payer: Self-pay | Admitting: Obstetrics and Gynecology

## 2021-01-01 DIAGNOSIS — Z1231 Encounter for screening mammogram for malignant neoplasm of breast: Secondary | ICD-10-CM

## 2021-02-20 ENCOUNTER — Ambulatory Visit
Admission: RE | Admit: 2021-02-20 | Discharge: 2021-02-20 | Disposition: A | Discharge status: Home / Self Care | Payer: Medicare HMO | Source: Ambulatory Visit | Attending: Obstetrics and Gynecology | Admitting: Obstetrics and Gynecology

## 2021-02-20 ENCOUNTER — Other Ambulatory Visit: Payer: Self-pay

## 2021-02-20 DIAGNOSIS — Z1231 Encounter for screening mammogram for malignant neoplasm of breast: Secondary | ICD-10-CM | POA: Diagnosis not present

## 2021-03-31 DIAGNOSIS — R030 Elevated blood-pressure reading, without diagnosis of hypertension: Secondary | ICD-10-CM | POA: Diagnosis not present

## 2021-05-22 DIAGNOSIS — M1711 Unilateral primary osteoarthritis, right knee: Secondary | ICD-10-CM | POA: Diagnosis not present

## 2021-05-28 ENCOUNTER — Encounter: Payer: Self-pay | Admitting: Cardiovascular Disease

## 2021-05-28 NOTE — Progress Notes (Signed)
Cardiology Office Note:    Date:  05/29/2021   ID:  Bridget Salas, DOB 07-05-1956, MRN [id number]  PCP:  Lupita Raider, MD   [facility] Providers Cardiologist:  Eugenia Pancoast to update primary MD,subspecialty MD or APP then REFRESH:1}    Referring MD: Lupita Raider, MD   Chief Complaint  Patient presents with   Hypertension    May 29, 2021   Bridget Salas is a 65 y.o. female with a hx of elevate BP recently I was asked by Emiliano Dyer, MS ( psychology) to see her for markedly elevated BP   BP has typically been well controlled. Her future daughter in law recently had a stroke ( April )  DIL has had severe HTN - had stopped her meds   Has been under lots of stress. Walks 3-5 miles a day . Avoids salt,   avoids salty foods Does eat healthy choice meals  - has backed off on the pre-prepared meals. She was just started on Lisinopril 10 mg a day  Has developed a dry hacky cough.    Wt today is 176   - was 256 lbs in 2015  No weight gain recently  Non smoker No ETOH, no drugs   No licorice  Is sleeping well   Has been under lots of stress with her DIL parents      Past Medical History:  Diagnosis Date   Headache(784.0)    Kidney stones    passed stone - no surgery   PONV (postoperative nausea and vomiting)    SVD (spontaneous vaginal delivery)    x 3    Past Surgical History:  Procedure Laterality Date   ABDOMINAL HYSTERECTOMY     BACK SURGERY     L4-l5   COLONOSCOPY     CYSTOCELE REPAIR N/A 05/29/2014   Procedure: ANTERIOR REPAIR (CYSTOCELE) with wide  local excision;  Surgeon: Geryl Rankins, MD;  Location: [facility];  Service: Gynecology;  Laterality: N/A;   JOINT REPLACEMENT     left knee   TUBAL LIGATION      Current Medications: Current Meds  Medication Sig   Ascorbic Acid (VITAMIN C) 100 MG tablet Take 100 mg by mouth daily.   cholecalciferol (VITAMIN D3) 25 MCG (1000 UNIT) tablet Take 1,000 Units by mouth daily.   CYSTEINE PO Take 1  tablet by mouth daily.   diclofenac (VOLTAREN) 75 MG EC tablet Take 1 tablet (75 mg total) by mouth 2 (two) times daily.   ibuprofen (ADVIL,MOTRIN) 800 MG tablet Take 1 tablet (800 mg total) by mouth every 8 (eight) hours.   Melatonin 10 MG/ML LIQD Take 2 mLs by mouth daily.   Menaquinone-7 (VITAMIN K2 PO) Take 1 tablet by mouth daily.   Multiple Vitamins-Minerals (MULTIVITAMIN WITH MINERALS) tablet Take 1 tablet by mouth daily.   NON FORMULARY Isagenix products   Omega-3 Fatty Acids (FISH OIL) 300 MG CAPS Take 1 capsule by mouth daily.   ondansetron (ZOFRAN) 8 MG tablet Take 1 tablet (8 mg total) by mouth every 8 (eight) hours as needed for nausea or vomiting.   SUMAtriptan (IMITREX) 100 MG tablet Take 100 mg by mouth every 2 (two) hours as needed for migraine or headache. May repeat in 2 hours if headache persists or recurs.   Turmeric (QC TUMERIC COMPLEX) 500 MG CAPS Take 1 tablet by mouth daily.   valsartan (DIOVAN) 160 MG tablet Take 1 tablet (160 mg total) by mouth daily.   Zinc 50  MG CAPS Take 1 capsule by mouth daily.   [DISCONTINUED] lisinopril (ZESTRIL) 10 MG tablet      Allergies:   Aleve [naproxen sodium] and Mobic [meloxicam]   Social History   Socioeconomic History   Marital status: Widowed    Spouse name: Not on file   Number of children: Not on file   Years of education: Not on file   Highest education level: Not on file  Occupational History   Not on file  Tobacco Use   Smoking status: Never   Smokeless tobacco: Never  Substance and Sexual Activity   Alcohol use: No   Drug use: No   Sexual activity: Yes    Birth control/protection: Surgical  Other Topics Concern   Not on file  Social History Narrative   Not on file   Social Determinants of Health   Financial Resource Strain: Not on file  Food Insecurity: Not on file  Transportation Needs: Not on file  Physical Activity: Not on file  Stress: Not on file  Social Connections: Not on file     Family  History: The patient's family history includes Hypertension in her father.  ROS:   Please see the history of present illness.     All other systems reviewed and are negative.  EKGs/Labs/Other Studies Reviewed:    The following studies were reviewed today:   EKG:     Recent Labs: No results found for requested labs within last 8760 hours.  Recent Lipid Panel No results found for: CHOL, TRIG, HDL, CHOLHDL, VLDL, LDLCALC, LDLDIRECT   Risk Assessment/Calculations:           Physical Exam:    VS:  BP (!) 150/86 (BP Location: Left Arm, Patient Position: Sitting, Cuff Size: Normal)   Pulse (!) 56   Ht 5\' 2"  (1.575 m)   Wt 176 lb (79.8 kg)   BMI 32.19 kg/m     Wt Readings from Last 3 Encounters:  05/29/21 176 lb (79.8 kg)  05/29/14 185 lb (83.9 kg)     GEN:  Well nourished, well developed in no acute distress HEENT: Normal NECK: No JVD; No carotid bruits LYMPHATICS: No lymphadenopathy CARDIAC: RRR, no murmurs, rubs, gallops RESPIRATORY:  Clear to auscultation without rales, wheezing or rhonchi  ABDOMEN: Soft, non-tender, non-distended MUSCULOSKELETAL:  No edema; No deformity  SKIN: Warm and dry NEUROLOGIC:  Alert and oriented x 3 PSYCHIATRIC:  Normal affect   ASSESSMENT:    1. Primary hypertension   2. Systolic murmur    PLAN:    In order of problems listed above:  HTN:   BP is elevated .  Is on Lisinopril ( but has developed a cough)  Will dC Lisiniopril Start Valsartan 160 mg a day  BMP in 3 weeks Advised continued diet , exercise, weight loss   Check labs today  - BMP, TSH, LFTs, lipids Will see her in a month          Medication Adjustments/Labs and Tests Ordered: Current medicines are reviewed at length with the patient today.  Concerns regarding medicines are outlined above.  Orders Placed This Encounter  Procedures   Basic metabolic panel   CBC   Hepatic function panel   Lipid panel   TSH   Basic metabolic panel   EKG 12-Lead    ECHOCARDIOGRAM COMPLETE   Meds ordered this encounter  Medications   valsartan (DIOVAN) 160 MG tablet    Sig: Take 1 tablet (160 mg total) by mouth daily.  Dispense:  90 tablet    Refill:  3    Patient Instructions  Medication Instructions:  Your physician has recommended you make the following change in your medication:   Stop Lisinopril Begin Valsartan, 160mg  tablet, once daily   Labwork: You will have labs drawn today: CBC, BMP, FLPs, LFTs, and TSH   Testing/Procedures: Your physician has requested that you have an echocardiogram. Echocardiography is a painless test that uses sound waves to create images of your heart. It provides your doctor with information about the size and shape of your heart and how well your heart's chambers and valves are working. This procedure takes approximately one hour. There are no restrictions for this procedure.  Follow-Up: Your physician recommends that you schedule a follow-up appointment in:   August 5 @ 10am with Dr. 03-13-1998   Any Other Special Instructions Will Be Listed Below (If Applicable).     If you need a refill on your cardiac medications before your next appointment, please call your pharmacy.   Signed, Elease Hashimoto, MD  05/29/2021 10:56 AM    [facility]

## 2021-05-29 ENCOUNTER — Encounter: Payer: Self-pay | Admitting: Cardiovascular Disease

## 2021-05-29 ENCOUNTER — Telehealth: Payer: Self-pay

## 2021-05-29 ENCOUNTER — Other Ambulatory Visit: Payer: Self-pay

## 2021-05-29 ENCOUNTER — Ambulatory Visit: Payer: Medicare HMO | Admitting: Cardiovascular Disease

## 2021-05-29 VITALS — BP 150/86 | HR 56 | Ht 62.0 in | Wt 176.0 lb

## 2021-05-29 DIAGNOSIS — R011 Cardiac murmur, unspecified: Secondary | ICD-10-CM | POA: Diagnosis not present

## 2021-05-29 DIAGNOSIS — I10 Essential (primary) hypertension: Secondary | ICD-10-CM

## 2021-05-29 DIAGNOSIS — E782 Mixed hyperlipidemia: Secondary | ICD-10-CM

## 2021-05-29 LAB — CBC
Hematocrit: 38.6 % (ref 34.0–46.6)
Hemoglobin: 13.3 g/dL (ref 11.1–15.9)
MCH: 30 pg (ref 26.6–33.0)
MCHC: 34.5 g/dL (ref 31.5–35.7)
MCV: 87 fL (ref 79–97)
Platelets: 272 10*3/uL (ref 150–450)
RBC: 4.44 x10E6/uL (ref 3.77–5.28)
RDW: 12.7 % (ref 11.7–15.4)
WBC: 7.9 10*3/uL (ref 3.4–10.8)

## 2021-05-29 LAB — BASIC METABOLIC PANEL
BUN/Creatinine Ratio: 17 (ref 12–28)
BUN: 19 mg/dL (ref 8–27)
CO2: 23 mmol/L (ref 20–29)
Calcium: 9.9 mg/dL (ref 8.7–10.3)
Chloride: 101 mmol/L (ref 96–106)
Creatinine, Ser: 1.12 mg/dL — ABNORMAL HIGH (ref 0.57–1.00)
Glucose: 99 mg/dL (ref 65–99)
Potassium: 4.1 mmol/L (ref 3.5–5.2)
Sodium: 140 mmol/L (ref 134–144)
eGFR: 54 mL/min/{1.73_m2} — ABNORMAL LOW (ref >59)

## 2021-05-29 LAB — HEPATIC FUNCTION PANEL
ALT: 18 IU/L (ref 0–32)
AST: 14 IU/L (ref 0–40)
Albumin: 4.7 g/dL (ref 3.8–4.8)
Alkaline Phosphatase: 88 IU/L (ref 44–121)
Bilirubin Total: 0.4 mg/dL (ref 0.0–1.2)
Bilirubin, Direct: 0.1 mg/dL (ref 0.00–0.40)
Total Protein: 7.1 g/dL (ref 6.0–8.5)

## 2021-05-29 LAB — LIPID PANEL
Chol/HDL Ratio: 4.3 ratio (ref 0.0–4.4)
Cholesterol, Total: 269 mg/dL — ABNORMAL HIGH (ref 100–199)
HDL: 62 mg/dL (ref >39)
LDL Chol Calc (NIH): 185 mg/dL — ABNORMAL HIGH (ref 0–99)
Triglycerides: 126 mg/dL (ref 0–149)
VLDL Cholesterol Cal: 22 mg/dL (ref 5–40)

## 2021-05-29 LAB — TSH: TSH: 2.36 u[IU]/mL (ref 0.450–4.500)

## 2021-05-29 MED ORDER — ROSUVASTATIN CALCIUM 10 MG PO TABS: 10.0000 mg | ORAL_TABLET | Freq: Every day | ORAL | 3 refills | Status: DC

## 2021-05-29 MED ORDER — VALSARTAN 160 MG PO TABS: 160.0000 mg | ORAL_TABLET | Freq: Every day | ORAL | 3 refills | Status: DC

## 2021-05-29 NOTE — Patient Instructions (Signed)
Medication Instructions:  Your physician has recommended you make the following change in your medication:   Stop Lisinopril Begin Valsartan, 160mg  tablet, once daily   Labwork: You will have labs drawn today: CBC, BMP, FLPs, LFTs, and TSH   Testing/Procedures: Your physician has requested that you have an echocardiogram. Echocardiography is a painless test that uses sound waves to create images of your heart. It provides your doctor with information about the size and shape of your heart and how well your heart's chambers and valves are working. This procedure takes approximately one hour. There are no restrictions for this procedure.  Follow-Up: Your physician recommends that you schedule a follow-up appointment in:   August 5 @ 10am with Dr. [id number]   Any Other Special Instructions Will Be Listed Below (If Applicable).     If you need a refill on your cardiac medications before your next appointment, please call your pharmacy.

## 2021-05-29 NOTE — Telephone Encounter (Signed)
-----   Message from Vesta Mixer, MD sent at 05/29/2021  4:50 PM EDT ----- BMP, liver enz, TSH are all WNL. We will keep an eye on her renal function ( just started valsartan)  CBC is pending  Chol levels are very high.  Lets start rosuvastatin 10 mg a day . Have her eat a very low cholesterol diet ,  cont to exercise regularly   Check lipids and ALT in 3 months

## 2021-05-29 NOTE — Telephone Encounter (Signed)
Pt aware of lab results and recommendations. She will begin rosuvastatin 10mg  qhs. She will avoid fatty foods and continue to walk regularly. Lab orders and appointment made for a recheck in 3 months.

## 2021-06-22 ENCOUNTER — Ambulatory Visit (HOSPITAL_COMMUNITY): Payer: Medicare HMO | Attending: Internal Medicine

## 2021-06-22 ENCOUNTER — Other Ambulatory Visit: Payer: Self-pay

## 2021-06-22 DIAGNOSIS — R011 Cardiac murmur, unspecified: Secondary | ICD-10-CM | POA: Diagnosis not present

## 2021-06-22 DIAGNOSIS — I10 Essential (primary) hypertension: Secondary | ICD-10-CM | POA: Insufficient documentation

## 2021-06-22 LAB — ECHOCARDIOGRAM COMPLETE
Area-P 1/2: 3.54 cm2
S' Lateral: 2.7 cm

## 2021-06-23 ENCOUNTER — Telehealth: Payer: Self-pay

## 2021-06-23 DIAGNOSIS — I779 Disorder of arteries and arterioles, unspecified: Secondary | ICD-10-CM

## 2021-06-23 NOTE — Telephone Encounter (Signed)
Pt aware of lab results and recommendations. She has agreed to the CT and will await a call from scheduling. She understands she will need a repeat BMP prior to scan.

## 2021-06-23 NOTE — Telephone Encounter (Signed)
-----   Message from Vesta Mixer, MD sent at 06/22/2021  5:47 PM EDT ----- Normal LV systolic function .   Mild ( grade 1) DD Mild MR  Mild dilatation of the ascending aorta. Please get a CT angio of the entire aorta

## 2021-06-25 ENCOUNTER — Other Ambulatory Visit: Payer: Self-pay

## 2021-06-25 ENCOUNTER — Ambulatory Visit (INDEPENDENT_AMBULATORY_CARE_PROVIDER_SITE_OTHER)
Admission: RE | Admit: 2021-06-25 | Discharge: 2021-06-25 | Disposition: A | Discharge status: Home / Self Care | Payer: Medicare HMO | Source: Ambulatory Visit | Attending: Cardiovascular Disease | Admitting: Cardiovascular Disease

## 2021-06-25 DIAGNOSIS — I779 Disorder of arteries and arterioles, unspecified: Secondary | ICD-10-CM | POA: Diagnosis not present

## 2021-06-25 DIAGNOSIS — I712 Thoracic aortic aneurysm, without rupture: Secondary | ICD-10-CM | POA: Diagnosis not present

## 2021-06-25 MED ORDER — IOHEXOL 350 MG/ML SOLN
100.0000 mL | Freq: Once | INTRAVENOUS | Status: AC | PRN
  Administered 2021-06-25: 100 mL via INTRAVENOUS

## 2021-06-29 ENCOUNTER — Other Ambulatory Visit: Payer: Medicare HMO | Admitting: *Deleted

## 2021-06-29 ENCOUNTER — Other Ambulatory Visit: Payer: Self-pay

## 2021-06-29 DIAGNOSIS — R011 Cardiac murmur, unspecified: Secondary | ICD-10-CM

## 2021-06-29 DIAGNOSIS — I10 Essential (primary) hypertension: Secondary | ICD-10-CM

## 2021-06-29 LAB — BASIC METABOLIC PANEL
BUN/Creatinine Ratio: 15 (ref 12–28)
BUN: 16 mg/dL (ref 8–27)
CO2: 22 mmol/L (ref 20–29)
Calcium: 9.5 mg/dL (ref 8.7–10.3)
Chloride: 104 mmol/L (ref 96–106)
Creatinine, Ser: 1.1 mg/dL — ABNORMAL HIGH (ref 0.57–1.00)
Glucose: 97 mg/dL (ref 65–99)
Potassium: 4.4 mmol/L (ref 3.5–5.2)
Sodium: 140 mmol/L (ref 134–144)
eGFR: 55 mL/min/{1.73_m2} — ABNORMAL LOW (ref >59)

## 2021-07-03 ENCOUNTER — Encounter: Payer: Self-pay | Admitting: Cardiovascular Disease

## 2021-07-03 ENCOUNTER — Other Ambulatory Visit: Payer: Self-pay

## 2021-07-03 ENCOUNTER — Ambulatory Visit: Payer: Medicare HMO | Admitting: Cardiovascular Disease

## 2021-07-03 VITALS — BP 150/82 | HR 59 | Ht 62.0 in | Wt 180.6 lb

## 2021-07-03 DIAGNOSIS — I77819 Aortic ectasia, unspecified site: Secondary | ICD-10-CM | POA: Diagnosis not present

## 2021-07-03 DIAGNOSIS — I77810 Thoracic aortic ectasia: Secondary | ICD-10-CM

## 2021-07-03 DIAGNOSIS — I10 Essential (primary) hypertension: Secondary | ICD-10-CM | POA: Diagnosis not present

## 2021-07-03 MED ORDER — POTASSIUM CHLORIDE ER 10 MEQ PO TBCR: 10.0000 meq | EXTENDED_RELEASE_TABLET | Freq: Every day | ORAL | 3 refills | Status: DC

## 2021-07-03 MED ORDER — CHLORTHALIDONE 25 MG PO TABS: 25.0000 mg | ORAL_TABLET | Freq: Every day | ORAL | 3 refills | Status: DC

## 2021-07-03 NOTE — Progress Notes (Signed)
Cardiology Office Note:    Date:  07/03/2021   ID:  Bridget Salas, DOB 1956-04-06, MRN [id number]  PCP:  Bridget Raider, MD   [facility] Providers Cardiologist:  Bridget Salas to update primary MD,subspecialty MD or APP then REFRESH:1}    Referring MD: Bridget Raider, MD   Chief Complaint  Patient presents with   Hypertension   Hyperlipidemia    May 29, 2021   Bridget Salas is a 65 y.o. female with a hx of elevate BP recently I was asked by Bridget Dyer, MS ( psychology) to see her for markedly elevated BP   BP has typically been well controlled. Her future daughter in law recently had a stroke ( April )  DIL has had severe HTN - had stopped her meds   Has been under lots of stress. Walks 3-5 miles a day . Avoids salt,   avoids salty foods Does eat healthy choice meals  - has backed off on the pre-prepared meals. She was just started on Lisinopril 10 mg a day  Has developed a dry hacky cough.    Wt today is 176   - was 256 lbs in 2015  No weight gain recently  Non smoker No ETOH, no drugs   No licorice  Is sleeping well   Has been under lots of stress with her   parents   Aug. 5, 2022;  I saw Bridget Salas for HTN recently  Echo showed normal LV systolic function   grade 1 DD Also showed mild aortic dilatation .  Aortic CTA showed ectatic asc. Aorta measuring 3.9  CM    BP is still a bit elevated  Wt is 180 No CP ,  no dyspnea Exercises regularly  Has been trying to avid salt  Avoids cholesterol   Past Medical History:  Diagnosis Date   Headache(784.0)    Kidney stones    passed stone - no surgery   PONV (postoperative nausea and vomiting)    SVD (spontaneous vaginal delivery)    x 3    Past Surgical History:  Procedure Laterality Date   ABDOMINAL HYSTERECTOMY     BACK SURGERY     L4-l5   COLONOSCOPY     CYSTOCELE REPAIR N/A 05/29/2014   Procedure: ANTERIOR REPAIR (CYSTOCELE) with wide  local excision;  Surgeon: Bridget Rankins, MD;   Location: [facility];  Service: Gynecology;  Laterality: N/A;   JOINT REPLACEMENT     left knee   TUBAL LIGATION      Current Medications: Current Meds  Medication Sig   Ascorbic Acid (VITAMIN C) 100 MG tablet Take 100 mg by mouth daily.   chlorthalidone (HYGROTON) 25 MG tablet Take 1 tablet (25 mg total) by mouth daily.   cholecalciferol (VITAMIN D3) 25 MCG (1000 UNIT) tablet Take 1,000 Units by mouth daily.   CYSTEINE PO Take 1 tablet by mouth daily.   Melatonin 10 MG/ML LIQD Take 2 mLs by mouth daily.   Menaquinone-7 (VITAMIN K2 PO) Take 1 tablet by mouth daily.   Multiple Vitamins-Minerals (MULTIVITAMIN WITH MINERALS) tablet Take 1 tablet by mouth daily.   NON FORMULARY [employer] products   potassium chloride (KLOR-CON) 10 MEQ tablet Take 1 tablet (10 mEq total) by mouth daily.   SUMAtriptan (IMITREX) 100 MG tablet Take 100 mg by mouth every 2 (two) hours as needed for migraine or headache. May repeat in 2 hours if headache persists or recurs.   Turmeric 500 MG CAPS Take 1 tablet  by mouth daily.   valsartan (DIOVAN) 160 MG tablet Take 1 tablet (160 mg total) by mouth daily.   Zinc 50 MG CAPS Take 1 capsule by mouth daily.     Allergies:   Aleve [naproxen sodium] and Mobic [meloxicam]   Social History   Socioeconomic History   Marital status: Widowed    Spouse name: Not on file   Number of children: Not on file   Years of education: Not on file   Highest education level: Not on file  Occupational History   Not on file  Tobacco Use   Smoking status: Never   Smokeless tobacco: Never  Substance and Sexual Activity   Alcohol use: No   Drug use: No   Sexual activity: Yes    Birth control/protection: Surgical  Other Topics Concern   Not on file  Social History Narrative   Not on file   Social Determinants of Health   Financial Resource Strain: Not on file  Food Insecurity: Not on file  Transportation Needs: Not on file  Physical Activity: Not on file  Stress: Not on  file  Social Connections: Not on file     Family History: The patient's family history includes Hypertension in her father.  ROS:   Please see the history of present illness.     All other systems reviewed and are negative.  EKGs/Labs/Other Studies Reviewed:    The following studies were reviewed today:   EKG:     Recent Labs: 05/29/2021: ALT 18; Hemoglobin 13.3; Platelets 272; TSH 2.360 06/29/2021: BUN 16; Creatinine, Ser 1.10; Potassium 4.4; Sodium 140  Recent Lipid Panel    Component Value Date/Time   CHOL 269 (H) 05/29/2021 0921   TRIG 126 05/29/2021 0921   HDL 62 05/29/2021 0921   CHOLHDL 4.3 05/29/2021 0921   LDLCALC 185 (H) 05/29/2021 0921     Risk Assessment/Calculations:           Physical Exam:    Physical Exam: Blood pressure (!) 150/82, pulse (!) 59, height 5\' 2"  (1.575 m), weight 180 lb 9.6 oz (81.9 kg), SpO2 96 %.  GEN:  Well nourished, well developed in no acute distress HEENT: Normal NECK: No JVD; No carotid bruits LYMPHATICS: No lymphadenopathy CARDIAC: RRR , no murmurs, rubs, gallops RESPIRATORY:  Clear to auscultation without rales, wheezing or rhonchi  ABDOMEN: Soft, non-tender, non-distended MUSCULOSKELETAL:  No edema; No deformity  SKIN: Warm and dry NEUROLOGIC:  Alert and oriented x 3   ASSESSMENT:    1. Primary hypertension   2. Ascending aorta dilatation (HCC)   3. Aortic dilatation (HCC)     PLAN:       HTN:   BP is mildly elevated.  We will add chlorthalidone 25 mg a day and potassium chloride 10 mg a day.  We will check a basic metabolic profile in 3 weeks.  We have given her advised to call back if she starts having some dizziness.  Room we may need to cut the pill in half. We will have her see an APP in 3 months for follow-up visit.  2.  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  Radiologist suggest that we follow-up with a repeat CTA in 3 years.  We will plan on scheduling a CTA of her  aorta in 2025.         Medication Adjustments/Labs and Tests Ordered: Current medicines are reviewed at length with the patient today.  Concerns regarding  medicines are outlined above.  Orders Placed This Encounter  Procedures   Basic Metabolic Panel (BMET)    Meds ordered this encounter  Medications   chlorthalidone (HYGROTON) 25 MG tablet    Sig: Take 1 tablet (25 mg total) by mouth daily.    Dispense:  90 tablet    Refill:  3   potassium chloride (KLOR-CON) 10 MEQ tablet    Sig: Take 1 tablet (10 mEq total) by mouth daily.    Dispense:  90 tablet    Refill:  3     Patient Instructions  Medication Instructions:  Your physician has recommended you make the following change in your medication:  START Chlorthalidone (Hygroton) 25 mg once daily in the morning START Potassium Chloride (Kdur) 10 mEq once daily  *If you need a refill on your cardiac medications before your next appointment, please call your pharmacy*   Lab Work: Your physician recommends that you return for lab work in: 3 weeks on Thursday Aug. 25. You do not have to FAST for this appointment and can come in anytime between the hours of 7:30 am and 4:45 pm  If you have labs (blood work) drawn today and your tests are completely normal, you will receive your results only by: MyChart Message (if you have MyChart) OR A paper copy in the mail If you have any lab test that is abnormal or we need to change your treatment, we will call you to review the results.   Testing/Procedures: None Ordered   Follow-Up: At [facility], you and your health needs are our priority.  As part of our continuing mission to provide you with exceptional heart care, we have created designated Provider Care Teams.  These Care Teams include your primary Cardiologist (physician) and Advanced Practice Providers (APPs -  Physician Assistants and Nurse Practitioners) who all work together to provide you with the care you need, when you  need it.   Your next appointment:   3 month(s) on Friday Nov. 4 at 3:00 pm  The format for your next appointment:   In Person  Provider:   Kristeen Miss, MD    Signed, Kristeen Miss, MD  07/03/2021 11:02 AM    [facility]

## 2021-07-03 NOTE — Patient Instructions (Signed)
Medication Instructions:  Your physician has recommended you make the following change in your medication:  START Chlorthalidone (Hygroton) 25 mg once daily in the morning START Potassium Chloride (Kdur) 10 mEq once daily  *If you need a refill on your cardiac medications before your next appointment, please call your pharmacy*   Lab Work: Your physician recommends that you return for lab work in: 3 weeks on Thursday Aug. 25. You do not have to FAST for this appointment and can come in anytime between the hours of 7:30 am and 4:45 pm  If you have labs (blood work) drawn today and your tests are completely normal, you will receive your results only by: MyChart Message (if you have MyChart) OR A paper copy in the mail If you have any lab test that is abnormal or we need to change your treatment, we will call you to review the results.   Testing/Procedures: None Ordered   Follow-Up: At [facility], you and your health needs are our priority.  As part of our continuing mission to provide you with exceptional heart care, we have created designated Provider Care Teams.  These Care Teams include your primary Cardiologist (physician) and Advanced Practice Providers (APPs -  Physician Assistants and Nurse Practitioners) who all work together to provide you with the care you need, when you need it.   Your next appointment:   3 month(s) on Friday Nov. 4 at 3:00 pm  The format for your next appointment:   In Person  Provider:   Kristeen Miss, MD

## 2021-07-22 DIAGNOSIS — I10 Essential (primary) hypertension: Secondary | ICD-10-CM

## 2021-07-23 ENCOUNTER — Other Ambulatory Visit: Payer: Self-pay

## 2021-07-23 ENCOUNTER — Telehealth: Payer: Self-pay

## 2021-07-23 ENCOUNTER — Other Ambulatory Visit: Payer: Medicare HMO | Admitting: *Deleted

## 2021-07-23 DIAGNOSIS — I10 Essential (primary) hypertension: Secondary | ICD-10-CM

## 2021-07-23 DIAGNOSIS — I77810 Thoracic aortic ectasia: Secondary | ICD-10-CM | POA: Diagnosis not present

## 2021-07-23 LAB — BASIC METABOLIC PANEL
BUN/Creatinine Ratio: 24 (ref 12–28)
BUN: 34 mg/dL — ABNORMAL HIGH (ref 8–27)
CO2: 26 mmol/L (ref 20–29)
Calcium: 9.8 mg/dL (ref 8.7–10.3)
Chloride: 99 mmol/L (ref 96–106)
Creatinine, Ser: 1.41 mg/dL — ABNORMAL HIGH (ref 0.57–1.00)
Glucose: 93 mg/dL (ref 65–99)
Potassium: 3.9 mmol/L (ref 3.5–5.2)
Sodium: 139 mmol/L (ref 134–144)
eGFR: 41 mL/min/{1.73_m2} — ABNORMAL LOW (ref >59)

## 2021-07-23 NOTE — Telephone Encounter (Signed)
Based on lab work today per Dr. Elease Hashimoto:  Her creatinine is up slightly ,    Please have her cut her chlorthalidone in 1/2 and take 1/2 tablet a day  Recheck bmp in 3-4 weeks .  Continue kdur at current dose.  We will likely need to start a different BP medication when she sees the APP in severla months.  Have her avoid all extra salt and salty foods.   Continue to exercise 4-5 times  a week.

## 2021-07-24 MED ORDER — CHLORTHALIDONE 25 MG PO TABS: 12.5000 mg | ORAL_TABLET | Freq: Every day | ORAL | 3 refills | Status: DC

## 2021-07-24 NOTE — Telephone Encounter (Signed)
See my chart message

## 2021-08-13 ENCOUNTER — Other Ambulatory Visit: Payer: Self-pay

## 2021-08-13 ENCOUNTER — Other Ambulatory Visit: Payer: Medicare HMO

## 2021-08-13 DIAGNOSIS — I10 Essential (primary) hypertension: Secondary | ICD-10-CM

## 2021-08-13 LAB — BASIC METABOLIC PANEL
BUN/Creatinine Ratio: 27 (ref 12–28)
BUN: 25 mg/dL (ref 8–27)
CO2: 22 mmol/L (ref 20–29)
Calcium: 9.8 mg/dL (ref 8.7–10.3)
Chloride: 102 mmol/L (ref 96–106)
Creatinine, Ser: 0.94 mg/dL (ref 0.57–1.00)
Glucose: 89 mg/dL (ref 65–99)
Potassium: 4.2 mmol/L (ref 3.5–5.2)
Sodium: 139 mmol/L (ref 134–144)
eGFR: 67 mL/min/{1.73_m2} (ref >59)

## 2021-09-01 ENCOUNTER — Other Ambulatory Visit: Payer: Self-pay

## 2021-09-01 ENCOUNTER — Other Ambulatory Visit: Payer: Medicare HMO | Admitting: *Deleted

## 2021-09-01 DIAGNOSIS — E782 Mixed hyperlipidemia: Secondary | ICD-10-CM | POA: Diagnosis not present

## 2021-09-01 LAB — LIPID PANEL
Chol/HDL Ratio: 5.1 ratio — ABNORMAL HIGH (ref 0.0–4.4)
Cholesterol, Total: 275 mg/dL — ABNORMAL HIGH (ref 100–199)
HDL: 54 mg/dL (ref >39)
LDL Chol Calc (NIH): 195 mg/dL — ABNORMAL HIGH (ref 0–99)
Triglycerides: 140 mg/dL (ref 0–149)
VLDL Cholesterol Cal: 26 mg/dL (ref 5–40)

## 2021-09-01 LAB — HEPATIC FUNCTION PANEL
ALT: 10 IU/L (ref 0–32)
AST: 15 IU/L (ref 0–40)
Albumin: 4.5 g/dL (ref 3.8–4.8)
Alkaline Phosphatase: 73 IU/L (ref 44–121)
Bilirubin Total: 0.3 mg/dL (ref 0.0–1.2)
Bilirubin, Direct: <0.1 mg/dL (ref 0.00–0.40)
Total Protein: 6.6 g/dL (ref 6.0–8.5)

## 2021-09-02 ENCOUNTER — Telehealth: Payer: Self-pay | Admitting: Nurse Practitioner

## 2021-09-02 DIAGNOSIS — E782 Mixed hyperlipidemia: Secondary | ICD-10-CM

## 2021-09-02 DIAGNOSIS — I10 Essential (primary) hypertension: Secondary | ICD-10-CM

## 2021-09-02 MED ORDER — ROSUVASTATIN CALCIUM 20 MG PO TABS: 20.0000 mg | ORAL_TABLET | Freq: Every day | ORAL | 3 refills | Status: DC

## 2021-09-02 NOTE — Telephone Encounter (Signed)
Patient has reviewed results and plan of care via MyChart. Sent new prescription to patient's pharmacy and scheduled lab appointment for 1/5.

## 2021-09-02 NOTE — Telephone Encounter (Signed)
-----   Message from Vesta Mixer, MD sent at 09/01/2021  5:28 PM EDT ----- LDL is very high Please start rosuvastatin 20 mg a day  Lipids and ALT in 2-3 months

## 2021-10-02 ENCOUNTER — Ambulatory Visit: Payer: Medicare HMO | Admitting: Cardiovascular Disease

## 2021-10-04 ENCOUNTER — Encounter: Payer: Self-pay | Admitting: Cardiovascular Disease

## 2021-10-04 NOTE — Progress Notes (Signed)
Cardiology Office Note:    Date:  10/05/2021   ID:  Bridget Salas, DOB 1956-01-18, MRN [id number]  PCP:  Bridget Raider, MD   [facility] Providers Cardiologist:  Bridget Salas to update primary MD,subspecialty MD or APP then REFRESH:1}    Referring MD: Bridget Raider, MD   Chief Complaint  Patient presents with   Hypertension         May 29, 2021   Bridget Salas is a 65 y.o. female with a hx of elevate BP recently I was asked by Bridget Dyer, MS ( psychology) to see her for markedly elevated BP   BP has typically been well controlled. Her future daughter in law recently had a stroke ( April )  DIL has had severe HTN - had stopped her meds   Has been under lots of stress. Walks 3-5 miles a day . Avoids salt,   avoids salty foods Does eat healthy choice meals  - has backed off on the pre-prepared meals. She was just started on Lisinopril 10 mg a day  Has developed a dry hacky cough.    Wt today is 176   - was 256 lbs in 2015  No weight gain recently  Non smoker No ETOH, no drugs   No licorice  Is sleeping well   Has been under lots of stress with her   parents   Aug. 5, 2022;  I saw Bridget Salas for HTN recently  Echo showed normal LV systolic function   grade 1 DD Also showed mild aortic dilatation .  Aortic CTA showed ectatic asc. Aorta measuring 3.9  CM    BP is still a bit elevated  Wt is 180 No CP ,  no dyspnea Exercises regularly  Has been trying to avid salt  Avoids cholesterol   Nov. 7, 2022 Bridget Salas is seen today for HTN, mild aortic diltation Grade 1 DD BP looks great.  Feels mentally like crap, Physically feels great  Getting some exercise   LDL in Oct. 4, 2022 is 195.  Is not taking her rosuvastatin - muscle aches.   CT angio of her chest showed mild dilatation of her transverse aorta 3.9 cm.    Past Medical History:  Diagnosis Date   Headache(784.0)    Kidney stones    passed stone - no surgery   PONV (postoperative nausea  and vomiting)    SVD (spontaneous vaginal delivery)    x 3    Past Surgical History:  Procedure Laterality Date   ABDOMINAL HYSTERECTOMY     BACK SURGERY     L4-l5   COLONOSCOPY     CYSTOCELE REPAIR N/A 05/29/2014   Procedure: ANTERIOR REPAIR (CYSTOCELE) with wide  local excision;  Surgeon: Geryl Rankins, MD;  Location: [facility];  Service: Gynecology;  Laterality: N/A;   JOINT REPLACEMENT     left knee   TUBAL LIGATION      Current Medications: Current Meds  Medication Sig   Ascorbic Acid (VITAMIN C) 100 MG tablet Take 100 mg by mouth daily.   chlorthalidone (HYGROTON) 25 MG tablet Take 0.5 tablets (12.5 mg total) by mouth daily.   cholecalciferol (VITAMIN D3) 25 MCG (1000 UNIT) tablet Take 1,000 Units by mouth daily.   CYSTEINE PO Take 1 tablet by mouth daily.   Melatonin 10 MG/ML LIQD Take 2 mLs by mouth daily.   Menaquinone-7 (VITAMIN K2 PO) Take 1 tablet by mouth daily.   potassium chloride (KLOR-CON) 10 MEQ tablet Take  1 tablet (10 mEq total) by mouth daily.   SUMAtriptan (IMITREX) 100 MG tablet Take 100 mg by mouth every 2 (two) hours as needed for migraine or headache. May repeat in 2 hours if headache persists or recurs.   Turmeric 500 MG CAPS Take 1 tablet by mouth daily.   valsartan (DIOVAN) 160 MG tablet Take 1 tablet (160 mg total) by mouth daily.   Zinc 50 MG CAPS Take 1 capsule by mouth daily.     Allergies:   Aleve [naproxen sodium] and Mobic [meloxicam]   Social History   Socioeconomic History   Marital status: Widowed    Spouse name: Not on file   Number of children: Not on file   Years of education: Not on file   Highest education level: Not on file  Occupational History   Not on file  Tobacco Use   Smoking status: Never   Smokeless tobacco: Never  Substance and Sexual Activity   Alcohol use: No   Drug use: No   Sexual activity: Yes    Birth control/protection: Surgical  Other Topics Concern   Not on file  Social History Narrative   Not on file    Social Determinants of Health   Financial Resource Strain: Not on file  Food Insecurity: Not on file  Transportation Needs: Not on file  Physical Activity: Not on file  Stress: Not on file  Social Connections: Not on file     Family History: The patient's family history includes Hypertension in her father.  ROS:   Please see the history of present illness.     All other systems reviewed and are negative.  EKGs/Labs/Other Studies Reviewed:    The following studies were reviewed today:   EKG:     Recent Labs: 05/29/2021: Hemoglobin 13.3; Platelets 272; TSH 2.360 08/13/2021: BUN 25; Creatinine, Ser 0.94; Potassium 4.2; Sodium 139 09/01/2021: ALT 10  Recent Lipid Panel    Component Value Date/Time   CHOL 275 (H) 09/01/2021 0806   TRIG 140 09/01/2021 0806   HDL 54 09/01/2021 0806   CHOLHDL 5.1 (H) 09/01/2021 0806   LDLCALC 195 (H) 09/01/2021 0806     Risk Assessment/Calculations:           Physical Exam:    Physical Exam: Blood pressure 130/72, pulse 68, height 5\' 2"  (1.575 m), weight 184 lb 3.2 oz (83.6 kg), SpO2 98 %.  GEN:  Well nourished, well developed in no acute distress HEENT: Normal NECK: No JVD; No carotid bruits LYMPHATICS: No lymphadenopathy CARDIAC: RRR , no murmurs, rubs, gallops RESPIRATORY:  Clear to auscultation without rales, wheezing or rhonchi  ABDOMEN: Soft, non-tender, non-distended MUSCULOSKELETAL:  No edema; No deformity  SKIN: Warm and dry NEUROLOGIC:  Alert and oriented x 3    ASSESSMENT:    No diagnosis found.   PLAN:       HTN:   Blood pressure looks well controlled.  Continue current medications.   2.  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  We will repeat her imaging in a year or so.  3.  Hyperlipidemia: She has a markedly elevated LDL at 195.  I recommend that we get a coronary calcium score for risk assessment.  She does not tolerate rosuvastatin.  I suspect that we will need to send her  to the lipid clinic for consideration of PCSK9 inhibitor or inclisaran .           Medication Adjustments/Labs and Tests Ordered: Current  medicines are reviewed at length with the patient today.  Concerns regarding medicines are outlined above.  No orders of the defined types were placed in this encounter.   No orders of the defined types were placed in this encounter.    There are no Patient Instructions on file for this visit.   Signed, Kristeen Miss, MD  10/05/2021 10:52 AM     [facility]

## 2021-10-05 ENCOUNTER — Other Ambulatory Visit: Payer: Self-pay

## 2021-10-05 ENCOUNTER — Encounter: Payer: Self-pay | Admitting: Cardiovascular Disease

## 2021-10-05 ENCOUNTER — Ambulatory Visit: Payer: Medicare HMO | Admitting: Cardiovascular Disease

## 2021-10-05 VITALS — BP 130/72 | HR 68 | Ht 62.0 in | Wt 184.2 lb

## 2021-10-05 DIAGNOSIS — I77819 Aortic ectasia, unspecified site: Secondary | ICD-10-CM

## 2021-10-05 DIAGNOSIS — E782 Mixed hyperlipidemia: Secondary | ICD-10-CM

## 2021-10-05 NOTE — Patient Instructions (Addendum)
Medication Instructions:  Your physician recommends that you continue on your current medications as directed. Please refer to the Current Medication list given to you today.  *If you need a refill on your cardiac medications before your next appointment, please call your pharmacy*   Lab Work: NONE If you have labs (blood work) drawn today and your tests are completely normal, you will receive your results only by: MyChart Message (if you have MyChart) OR A paper copy in the mail If you have any lab test that is abnormal or we need to change your treatment, we will call you to review the results.   Testing/Procedures: Your physician has requested that you have a Coronary Calcium Score.   This is a self pay test at a cost of $99.   Follow-Up: At [facility], you and your health needs are our priority.  As part of our continuing mission to provide you with exceptional heart care, we have created designated Provider Care Teams.  These Care Teams include your primary Cardiologist (physician) and Advanced Practice Providers (APPs -  Physician Assistants and Nurse Practitioners) who all work together to provide you with the care you need, when you need it.   Your next appointment:   1 year(s)  The format for your next appointment:   In Person  Provider:   Kristeen Miss, MD or APP

## 2021-10-30 ENCOUNTER — Ambulatory Visit (INDEPENDENT_AMBULATORY_CARE_PROVIDER_SITE_OTHER)
Admission: RE | Admit: 2021-10-30 | Discharge: 2021-10-30 | Disposition: A | Discharge status: Home / Self Care | Payer: Self-pay | Source: Ambulatory Visit | Attending: Cardiovascular Disease | Admitting: Cardiovascular Disease

## 2021-10-30 ENCOUNTER — Other Ambulatory Visit: Payer: Self-pay

## 2021-10-30 DIAGNOSIS — E782 Mixed hyperlipidemia: Secondary | ICD-10-CM

## 2021-10-30 DIAGNOSIS — I77819 Aortic ectasia, unspecified site: Secondary | ICD-10-CM

## 2021-11-02 ENCOUNTER — Other Ambulatory Visit: Payer: Self-pay | Admitting: *Deleted

## 2021-11-02 DIAGNOSIS — E782 Mixed hyperlipidemia: Secondary | ICD-10-CM

## 2021-12-03 ENCOUNTER — Other Ambulatory Visit: Payer: Medicare HMO | Admitting: *Deleted

## 2021-12-03 ENCOUNTER — Other Ambulatory Visit: Payer: Self-pay

## 2021-12-03 DIAGNOSIS — E782 Mixed hyperlipidemia: Secondary | ICD-10-CM | POA: Diagnosis not present

## 2021-12-03 DIAGNOSIS — I10 Essential (primary) hypertension: Secondary | ICD-10-CM

## 2021-12-03 LAB — LIPID PANEL
Chol/HDL Ratio: 4.6 ratio — ABNORMAL HIGH (ref 0.0–4.4)
Cholesterol, Total: 264 mg/dL — ABNORMAL HIGH (ref 100–199)
HDL: 58 mg/dL (ref >39)
LDL Chol Calc (NIH): 187 mg/dL — ABNORMAL HIGH (ref 0–99)
Triglycerides: 106 mg/dL (ref 0–149)
VLDL Cholesterol Cal: 19 mg/dL (ref 5–40)

## 2021-12-03 LAB — ALT: ALT: 13 IU/L (ref 0–32)

## 2021-12-07 ENCOUNTER — Telehealth: Payer: Self-pay

## 2021-12-07 DIAGNOSIS — E782 Mixed hyperlipidemia: Secondary | ICD-10-CM

## 2021-12-07 NOTE — Telephone Encounter (Signed)
Referral placed per Dr. Acie Fredrickson.

## 2021-12-07 NOTE — Telephone Encounter (Signed)
-----   Message from Vesta Mixer, MD sent at 12/06/2021  9:28 AM EST ----- LDL remains elevated.  I dont think she is taking her rosuvastatin.  She has had significant muscle aches on rosuvastatin Coronary calcium score is 73 - 76th percentile for age/ gender matched controls  Please refer to lipid clinic for consideration of PCSK9i , Inclisiran or other lipid lowering agents.

## 2021-12-10 ENCOUNTER — Other Ambulatory Visit: Payer: Self-pay

## 2021-12-10 ENCOUNTER — Ambulatory Visit: Payer: Medicare HMO | Admitting: Pharmacist

## 2021-12-10 DIAGNOSIS — E785 Hyperlipidemia, unspecified: Secondary | ICD-10-CM | POA: Insufficient documentation

## 2021-12-10 DIAGNOSIS — E782 Mixed hyperlipidemia: Secondary | ICD-10-CM

## 2021-12-10 DIAGNOSIS — R931 Abnormal findings on diagnostic imaging of heart and coronary circulation: Secondary | ICD-10-CM

## 2021-12-10 NOTE — Patient Instructions (Addendum)
T was nice to see you today!  Your LDL cholesterol is 187 and your goal is < 70  Continue to focus on a heart healthy diet and exercise  Recheck fasting cholesterol on Monday, March 20th any time after 7:30am  If your LDL remains high, I'd recommend trying a low dose of rosuvastatin (Crestor)

## 2021-12-10 NOTE — Progress Notes (Addendum)
Patient ID: SILA SARSFIELD                 DOB: 1956-06-09                    MRN: [id number]     HPI: Bridget Salas is a 66 y.o. female patient referred to lipid clinic by Dr. Elease Hashimoto. PMH is significant for HTN, HLD, and elevated calcium score. Echo 05/2021 showed normal LV function mild MR, and mild dilatation of the ascending aorta. Pt was prescribed rosuvastatin 10 mg daily after LDL was elevated at 185 in July 2022. At follow up visit on 09/02/2021, LDL was still elevated at 664. At that time, Dr. Elease Hashimoto recommended pt increase rosuvastatin to 20 mg daily. She underwent coronary calcium scoring which revealed calcium score of 73 (76th percentile for age/gender matched controls). Follow up LDL in January remained elevated at 187 and pt was referred to PharmD.  Today, pt is interested to learn about different ways to lower cholesterol levels.  Pt revealed that 6-7 years ago she believed she had an elevated TC near 300, which dropped about 100 mg/dL when she lost 70 lbs using Isagenix. Pt took a different statin (unknown brand or dose) years ago and had severe stomach and muscles pain. Pt states she never took rosuvastatin 10 mg or 20 mg daily due to prior intolerance with a statin. Most recent labs reflect some improvement in cholesterol - pt reports losing 9 lbs, taking supplements, and making improvements to her diet. She does not like taking medications and prefers supplements over prescription medications. She is interested in maximizing LDL lowering effects of lifestyle changes prior to trying medication. She did recently start taking red yeast rice. Pt states she has not been able to give herself Imitrex injections in the past, so states she would not be able to give herself a twice monthly PCSK9 inhibitor.  Current Medications: red yeast rice 1200 mg daily  Intolerances: statin - unknown brand, strength, frequency (significant muscle aches)  Risk Factors: CAC 73 (76th percentile for age/sex),  HTN  LDL goal: <70 mg/dL  Diet: Eating lima beans, dark kidney beans, pomegranate juice, beat juice, avoids all red meat (eats roasted chicken, [place], salmon), avoids dairy and sweets. Previously was eating more cheese but cut this from her diet. When she does have bread, eats multigrain  Exercise: walks down > 3 miles/day with dog to stay active  Family History: Hypertension in her father, high cholesterol in sister, heart disease in her mother.  Social History: Non smoker, No ETOH, no drugs   Labs:  12/03/2021: TC 264, HDL 58, TGL 106, LDL 187 (no LLT) 09/01/2021: TC 275, HDL 54, TGL 140, LDL 195 (no LLT)   Past Medical History:  Diagnosis Date   Headache(784.0)    Kidney stones    passed stone - no surgery   PONV (postoperative nausea and vomiting)    SVD (spontaneous vaginal delivery)    x 3    Current Outpatient Medications on File Prior to Visit  Medication Sig Dispense Refill   Ascorbic Acid (VITAMIN C) 100 MG tablet Take 100 mg by mouth daily.     chlorthalidone (HYGROTON) 25 MG tablet Take 0.5 tablets (12.5 mg total) by mouth daily. 45 tablet 3   cholecalciferol (VITAMIN D3) 25 MCG (1000 UNIT) tablet Take 1,000 Units by mouth daily.     CYSTEINE PO Take 1 tablet by mouth daily.     diclofenac (VOLTAREN)  75 MG EC tablet Take 1 tablet (75 mg total) by mouth 2 (two) times daily. (Patient not taking: No sig reported) 50 tablet 2   ibuprofen (ADVIL,MOTRIN) 800 MG tablet Take 1 tablet (800 mg total) by mouth every 8 (eight) hours. (Patient not taking: No sig reported) 30 tablet 0   Melatonin 10 MG/ML LIQD Take 2 mLs by mouth daily.     Menaquinone-7 (VITAMIN K2 PO) Take 1 tablet by mouth daily.     Multiple Vitamins-Minerals (MULTIVITAMIN WITH MINERALS) tablet Take 1 tablet by mouth daily. (Patient not taking: Reported on 10/05/2021)     NON FORMULARY Isagenix products     Omega-3 Fatty Acids (FISH OIL) 300 MG CAPS Take 1 capsule by mouth daily. (Patient not taking: No sig  reported)     ondansetron (ZOFRAN) 8 MG tablet Take 1 tablet (8 mg total) by mouth every 8 (eight) hours as needed for nausea or vomiting. (Patient not taking: No sig reported) 20 tablet 1   potassium chloride (KLOR-CON) 10 MEQ tablet Take 1 tablet (10 mEq total) by mouth daily. 90 tablet 3   rosuvastatin (CRESTOR) 20 MG tablet Take 1 tablet (20 mg total) by mouth daily. (Patient not taking: Reported on 10/05/2021) 90 tablet 3   SUMAtriptan (IMITREX) 100 MG tablet Take 100 mg by mouth every 2 (two) hours as needed for migraine or headache. May repeat in 2 hours if headache persists or recurs.     Turmeric 500 MG CAPS Take 1 tablet by mouth daily.     valsartan (DIOVAN) 160 MG tablet Take 1 tablet (160 mg total) by mouth daily. 90 tablet 3   Zinc 50 MG CAPS Take 1 capsule by mouth daily.     No current facility-administered medications on file prior to visit.    Allergies  Allergen Reactions   Aleve [Naproxen Sodium]     Pt stated, "gave me sores in my mouth"   Mobic [Meloxicam] Hives    Assessment/Plan:  1. Hyperlipidemia - Baseline LDL elevated at 187 above goal 70mg /dL due to elevated calcium score. Discussed statin rechallenge or initiation of PCSK9 inhibitor today. Pt is not interested in starting prescription medication today. She prefers to continue to focus on lifestyle improvements and recheck cholesterol in March to reassess. If cholesterol remains elevated at that time, she may be more agreeable to medication. Reviewed cardiac data and benefit of prescription medications like statins and PCSK9i compared to the supplement market which is unregulated and not required to show efficacy or safety of any marketed products. Reports she would be unable to give herself injections. She will look to see which statin her PCP prescribed for her years ago. Recommend trying rosuvastatin pending results of upcoming lipid panel.  Addendum: pt states PCP tried her on rosuvastatin back in 2010 which  she did not tolerate, so will need to discuss other statins pending upcoming lab work.  Clarice Pole, PharmD Candidate 2023  Megan E. Supple, PharmD, BCACP, CPP [facility] [address] [place], [place] [address] Phone: [phone]; Fax: [phone] 12/10/2021 11:26 AM

## 2021-12-24 ENCOUNTER — Encounter: Payer: Self-pay | Admitting: Cardiovascular Disease

## 2021-12-24 ENCOUNTER — Encounter: Payer: Self-pay | Admitting: Pharmacist

## 2021-12-24 NOTE — Telephone Encounter (Signed)
Pt sent in duplicate mychart messages. Already responded in other encounter.

## 2022-01-04 DIAGNOSIS — D071 Carcinoma in situ of vulva: Secondary | ICD-10-CM | POA: Diagnosis not present

## 2022-01-04 DIAGNOSIS — Z01419 Encounter for gynecological examination (general) (routine) without abnormal findings: Secondary | ICD-10-CM | POA: Diagnosis not present

## 2022-01-26 ENCOUNTER — Encounter: Payer: Self-pay | Admitting: Cardiovascular Disease

## 2022-01-26 ENCOUNTER — Other Ambulatory Visit: Payer: Self-pay | Admitting: Cardiovascular Disease

## 2022-01-26 DIAGNOSIS — I10 Essential (primary) hypertension: Secondary | ICD-10-CM

## 2022-01-26 DIAGNOSIS — Z79899 Other long term (current) drug therapy: Secondary | ICD-10-CM

## 2022-01-26 MED ORDER — VALSARTAN 80 MG PO TABS: 80.0000 mg | ORAL_TABLET | Freq: Every day | ORAL | 3 refills | Status: DC

## 2022-01-26 NOTE — Telephone Encounter (Signed)
Called patient to review blood pressure readings over last 5 days and she gave below record: 2/23 108/73 2/24 127/81 2/25 116/72 2/26 124/78 2/27 122/73 2/28 @8 :30a=108/67, now @12 :45=123/70  Pt states that when pressure hits the teens or below, she experiences dizziness. Pt states that all readings were done in the mornings before medication and food/beverages, on same arm, and in same position. Condones that she is currently taking 1/2 tablet of her Chlorthalidone as instructed, but would like to know if she should decrease her Valsartan by half as well (currently taking 160mg )?   States she is working out and following a much and believes this may be causing an improvement in her baseline pressures, so that she is not requiring as much BP lowering medication.   Will route to MD for advisement.

## 2022-01-26 NOTE — Progress Notes (Signed)
Updated dose of Valsartan to 80mg  QD per Nahser.

## 2022-02-02 ENCOUNTER — Encounter: Payer: Self-pay | Admitting: Cardiovascular Disease

## 2022-02-02 NOTE — Telephone Encounter (Signed)
Returned call and spoke to patient regarding decreasing her Diovan dose to 80mg  after having episodes of hypotension on the 160mg . Pt provides following log: ?3/7 147/80 ?3/6 141/83 ?3/5 163/86 *she had a cookout at her house the night prior to celebrate her birthday and ate hamburger, baked beans, potato salad=consumed increased sodium food ?3/4 137/94 ?3/3 143/79 ?3/2 121/72 ?3/1 134/77 ? ?Pt feels these readings are still too high. All readings are in the AM before medication. Affirms that she is still taking her chlortalidone 0.5 tablet.  ? ?Encouraged pt that the above readings are considered stage 1 htn, but are much better than being hypotensive with the 160mg  dose. Pt denies any symptoms of HTN such as headache, vision changes, or increased urination. Advised that patient may benefit from taking 1 and 1/2 tablets of the 80mg , but that we will need to check with Dr 5/7 on this. Pt understands and is willing to try adjustments as needed. Will route to Nahser for recommendation. ?

## 2022-02-03 DIAGNOSIS — H52223 Regular astigmatism, bilateral: Secondary | ICD-10-CM | POA: Diagnosis not present

## 2022-02-15 ENCOUNTER — Other Ambulatory Visit: Payer: Medicare HMO | Admitting: *Deleted

## 2022-02-15 ENCOUNTER — Other Ambulatory Visit: Payer: Self-pay

## 2022-02-15 DIAGNOSIS — E782 Mixed hyperlipidemia: Secondary | ICD-10-CM | POA: Diagnosis not present

## 2022-02-15 LAB — LIPID PANEL
Chol/HDL Ratio: 4.2 ratio (ref 0.0–4.4)
Cholesterol, Total: 239 mg/dL — ABNORMAL HIGH (ref 100–199)
HDL: 57 mg/dL (ref >39)
LDL Chol Calc (NIH): 165 mg/dL — ABNORMAL HIGH (ref 0–99)
Triglycerides: 99 mg/dL (ref 0–149)
VLDL Cholesterol Cal: 17 mg/dL (ref 5–40)

## 2022-02-22 DIAGNOSIS — G548 Other nerve root and plexus disorders: Secondary | ICD-10-CM | POA: Diagnosis not present

## 2022-02-22 DIAGNOSIS — L820 Inflamed seborrheic keratosis: Secondary | ICD-10-CM | POA: Diagnosis not present

## 2022-02-23 ENCOUNTER — Telehealth: Payer: Self-pay | Admitting: Pharmacist

## 2022-02-23 DIAGNOSIS — I10 Essential (primary) hypertension: Secondary | ICD-10-CM

## 2022-02-23 DIAGNOSIS — E782 Mixed hyperlipidemia: Secondary | ICD-10-CM

## 2022-02-23 NOTE — Telephone Encounter (Signed)
Updated baseline lipids checked per pt request. Did not want to start medication at last visit and preferred to focus on lifestyle. LDL has decreased from 187 to 165, still far above goal < 70 given elevated CAC. Previously intolerant to rosuvastatin (unknown dose), has not tried any other statins or lipid lowering therapy, doesn't want to give herself injectable PCSK9i therapy. Discussed statin rechallenge with pt. She still refuses medication at this time, prefers again to focus on lifestyle for the next few months. Discussed that this will not drop her LDL by the required 50% and that medication will be needed to control her cholesterol. Will recheck lipids in July per pt request. ?

## 2022-04-15 DIAGNOSIS — H04203 Unspecified epiphora, bilateral lacrimal glands: Secondary | ICD-10-CM | POA: Diagnosis not present

## 2022-04-15 DIAGNOSIS — H02132 Senile ectropion of right lower eyelid: Secondary | ICD-10-CM | POA: Diagnosis not present

## 2022-04-15 DIAGNOSIS — H04523 Eversion of bilateral lacrimal punctum: Secondary | ICD-10-CM | POA: Diagnosis not present

## 2022-04-15 DIAGNOSIS — H02831 Dermatochalasis of right upper eyelid: Secondary | ICD-10-CM | POA: Diagnosis not present

## 2022-04-29 ENCOUNTER — Encounter: Payer: Self-pay | Admitting: Emergency Medicine

## 2022-04-29 ENCOUNTER — Ambulatory Visit
Admission: EM | Admit: 2022-04-29 | Discharge: 2022-04-29 | Disposition: A | Discharge status: Home / Self Care | Payer: Medicare HMO | Attending: Family Medicine | Admitting: Family Medicine

## 2022-04-29 DIAGNOSIS — T23152A Burn of first degree of left palm, initial encounter: Secondary | ICD-10-CM

## 2022-04-29 MED ORDER — SILVER SULFADIAZINE 1 % EX CREA: 1.0000 "application " | TOPICAL_CREAM | Freq: Two times a day (BID) | CUTANEOUS | 0 refills | Status: DC | PRN

## 2022-04-29 NOTE — ED Triage Notes (Signed)
Pt burned her left hand on the carburetor of her pressure washer today.

## 2022-04-29 NOTE — ED Provider Notes (Signed)
Bridget Salas    CSN: [id number] Arrival date & time: 04/29/22  1255      History   Chief Complaint Chief Complaint  Patient presents with   Burn    HPI Bridget Salas is a 66 y.o. female.   HPI Patient presents for evaluation of a burn on left palmer surface of hand. She reports using her power washer and accident placing her hand on the carburetor which was hot and immediately sustaining a burn. She applied silvadene cream , aloe and honey. She is out of silvadene cream and needs refill. The burn occurred less than one hour ago. She declines TDAP.  Endorses full sensation and skin is present intact. 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      Home Medications    Prior to Admission medications   Medication Sig Start Date End Date Taking? Authorizing Provider  Ascorbic Acid (VITAMIN C) 100 MG tablet Take 100 mg by mouth daily.    [provider]  chlorthalidone (HYGROTON) 25 MG tablet Take 0.5 tablets (12.5 mg total) by mouth daily. 07/24/21 10/22/21  Nahser,  Wonda Cheng, MD  cholecalciferol (VITAMIN D3) 25 MCG (1000 UNIT) tablet Take 1,000 Units by mouth daily.    [provider]  CYSTEINE PO Take 600 mg by mouth daily.    [provider]  Melatonin 10 MG/ML LIQD Take 2 mLs by mouth daily.    [provider]  Menaquinone-7 (VITAMIN K2 PO) Take 1 tablet by mouth daily.    [provider]  NON FORMULARY Isagenix products    [provider]  NON FORMULARY Juice Plus - contains omega blend, fruit blend, berry blend, and vegetable blend    [provider]  potassium chloride (KLOR-CON) 10 MEQ tablet Take 1 tablet (10 mEq total) by mouth daily. 07/03/21   Nahser, Wonda Cheng, MD  Red Yeast Rice 600 MG CAPS Take 1,200 mg by mouth daily.    [provider]  SUMAtriptan (IMITREX) 100 MG tablet Take 100 mg by mouth every 2 (two) hours as needed for migraine or headache. May repeat in 2 hours if headache persists or recurs.    [provider]  Turmeric 500 MG CAPS Take 1 tablet by mouth daily. Contains turmeric 300mg , ginger 50mg , and black pepper 10mg     [provider]  valsartan (DIOVAN) 80 MG tablet Take 1 tablet (80 mg total) by mouth daily. 01/26/22  Nahser, Wonda Cheng, MD  Zinc 50 MG CAPS Take 1 capsule by mouth daily.    [provider]    Family History Family History  Problem Relation Age of Onset   Hypertension Father     Social History Social History   Tobacco Use   Smoking status: Never   Smokeless tobacco: Never  Vaping Use   Vaping Use: Never used  Substance Use Topics   Alcohol use: No   Drug use: No     Allergies   Aleve [naproxen sodium], Mobic [meloxicam], and Rosuvastatin   Review of Systems Review of Systems Pertinent negatives listed in HPI   Physical Exam Triage Vital Signs ED Triage Vitals  Enc Vitals Group     BP 04/29/22 1320 132/88     Pulse Rate 04/29/22 1320 68     Resp 04/29/22 1320 18     Temp 04/29/22 1320 97.9 F (36.6  C)     Temp Source 04/29/22 1320 Oral     SpO2 04/29/22 1320 98 %     Weight --      Height --      Head Circumference --      Peak Flow --      Pain Score 04/29/22 1322 8     Pain Loc --      Pain Edu? --      Excl. in [place]? --    No data found.  Updated Vital Signs BP 132/88 (BP Location: Right Arm)   Pulse 68   Temp 97.9 F (36.6 C) (Oral)   Resp 18   SpO2 98%   Visual Acuity Right Eye Distance:   Left Eye Distance:   Bilateral Distance:    Right Eye Near:   Left Eye Near:    Bilateral Near:     Physical Exam Constitutional:      Appearance: Normal appearance.  Eyes:     Extraocular Movements: Extraocular movements intact.     Pupils: Pupils are equal, round, and reactive to light.  Cardiovascular:     Rate and Rhythm: Normal rate and regular rhythm.  Pulmonary:     Effort: Pulmonary effort is normal.     Breath sounds: Normal breath sounds.  Musculoskeletal:     Left hand: Swelling and tenderness present. Normal range of motion.     Comments: Mild erythema palmar hand, no blistering or skin abrasion at present   Neurological:     Mental Status: She is alert.     UC Treatments / Results  Labs (all labs ordered are listed, but only abnormal results are displayed) Labs Reviewed - No data to display  EKG   Radiology No results found.  Procedures Procedures (including critical care time)  Medications Ordered in UC Medications - No data to display  Initial Impression / Assessment and Plan / UC Course  I have reviewed the triage vital signs and the nursing notes.  Pertinent labs & imaging results that were available during my care of the patient were reviewed by me and considered in my medical decision making (see chart for details).    Refilled Silvadene cream Advised to keep hand covered for comfort. Monitor for signs of infection. Return as needed  Final Clinical Impressions(s) / UC Diagnoses   Final diagnoses:  Superficial burn of palm  of left hand, initial encounter   Discharge Instructions   None    ED Prescriptions     Medication Sig Dispense Auth. Provider   silver  sulfADIAZINE (SILVADENE) 1 % cream Apply 1 application. topically 2 (two) times daily as needed. Clean site of apply directly to burn wound and apply cream. 50 g Scot Jun, FNP      PDMP not reviewed this encounter.   Scot Jun, FNP 05/01/22 1300

## 2022-05-19 ENCOUNTER — Other Ambulatory Visit: Payer: Self-pay | Admitting: Cardiovascular Disease

## 2022-05-19 DIAGNOSIS — H02132 Senile ectropion of right lower eyelid: Secondary | ICD-10-CM | POA: Diagnosis not present

## 2022-05-19 DIAGNOSIS — H04523 Eversion of bilateral lacrimal punctum: Secondary | ICD-10-CM | POA: Diagnosis not present

## 2022-05-19 DIAGNOSIS — Z01818 Encounter for other preprocedural examination: Secondary | ICD-10-CM | POA: Diagnosis not present

## 2022-05-19 DIAGNOSIS — H02135 Senile ectropion of left lower eyelid: Secondary | ICD-10-CM | POA: Diagnosis not present

## 2022-05-31 ENCOUNTER — Other Ambulatory Visit: Payer: Medicare HMO | Admitting: *Deleted

## 2022-05-31 DIAGNOSIS — E782 Mixed hyperlipidemia: Secondary | ICD-10-CM | POA: Diagnosis not present

## 2022-05-31 LAB — LIPID PANEL
Chol/HDL Ratio: 4.5 ratio — ABNORMAL HIGH (ref 0.0–4.4)
Cholesterol, Total: 263 mg/dL — ABNORMAL HIGH (ref 100–199)
HDL: 58 mg/dL (ref >39)
LDL Chol Calc (NIH): 179 mg/dL — ABNORMAL HIGH (ref 0–99)
Triglycerides: 143 mg/dL (ref 0–149)
VLDL Cholesterol Cal: 26 mg/dL (ref 5–40)

## 2022-06-02 ENCOUNTER — Telehealth: Payer: Self-pay | Admitting: Pharmacist

## 2022-06-02 DIAGNOSIS — H02135 Senile ectropion of left lower eyelid: Secondary | ICD-10-CM | POA: Diagnosis not present

## 2022-06-02 DIAGNOSIS — H04523 Eversion of bilateral lacrimal punctum: Secondary | ICD-10-CM | POA: Diagnosis not present

## 2022-06-02 DIAGNOSIS — H02102 Unspecified ectropion of right lower eyelid: Secondary | ICD-10-CM | POA: Diagnosis not present

## 2022-06-02 DIAGNOSIS — H02105 Unspecified ectropion of left lower eyelid: Secondary | ICD-10-CM | POA: Diagnosis not present

## 2022-06-02 DIAGNOSIS — H02132 Senile ectropion of right lower eyelid: Secondary | ICD-10-CM | POA: Diagnosis not present

## 2022-06-02 NOTE — Telephone Encounter (Signed)
Baseline lipids remain very elevated and far above LDL goal < 70. Pt previously intolerant to rosuvastatin and did not want to try any other cholesterol medications. Wanted to focus on lifestyle, LDL has increased. Left message for pt to discuss, she needs to try another statin if willing. Would recommend atorvastatin 10-20mg  daily if she's willing based on how high her baseline LDL is.

## 2022-07-21 DIAGNOSIS — H02834 Dermatochalasis of left upper eyelid: Secondary | ICD-10-CM | POA: Diagnosis not present

## 2022-07-21 DIAGNOSIS — H02831 Dermatochalasis of right upper eyelid: Secondary | ICD-10-CM | POA: Diagnosis not present

## 2022-08-20 ENCOUNTER — Other Ambulatory Visit: Payer: Self-pay | Admitting: Cardiovascular Disease

## 2022-12-26 ENCOUNTER — Encounter: Payer: Self-pay | Admitting: Cardiovascular Disease

## 2022-12-26 NOTE — Progress Notes (Unsigned)
Cardiology Office Note:    Date:  12/27/2022   ID:  Bridget Salas, DOB 1956/03/11, MRN [id number]  PCP:  Mayra Neer, MD   [facility] Providers Cardiologist:  Sheanna Dail    Referring MD: Mayra Neer, MD   Chief Complaint  Patient presents with   Hyperlipidemia   Hypertension         May 29, 2021   Bridget Salas is a 67 y.o. female with a hx of elevate BP recently I was asked by Bridget Slocumb, MS ( psychology) to see her for markedly elevated BP   BP has typically been well controlled. Her future daughter in law recently had a stroke ( April )  DIL has had severe HTN - had stopped her meds   Has been under lots of stress. Walks 3-5 miles a day . Avoids salt,   avoids salty foods Does eat healthy choice meals  - has backed off on the pre-prepared meals. She was just started on Lisinopril 10 mg a day  Has developed a dry hacky cough.    Wt today is 176   - was 256 lbs in 2015  No weight gain recently  Non smoker No ETOH, no drugs   No licorice  Is sleeping well   Has been under lots of stress with her   parents   Aug. 5, 2022;  I saw Bridget Salas for HTN recently  Echo showed normal LV systolic function   grade 1 DD Also showed mild aortic dilatation .  Aortic CTA showed ectatic asc. Aorta measuring 3.9  CM    BP is still a bit elevated  Wt is 180 No CP ,  no dyspnea Exercises regularly  Has been trying to avid salt  Avoids cholesterol   Nov. 7, 2022 Bridget Salas is seen today for HTN, mild aortic diltation Grade 1 DD BP looks great.  Feels mentally like crap, Physically feels great  Getting some exercise   LDL in Oct. 4, 2022 is 195.  Is not taking her rosuvastatin - muscle aches.   CT angio of her chest showed mild dilatation of her transverse aorta 3.9 cm.   Jan. 29, 2024  Bridget Salas is seen today for follow up of her HTN, mild aortic dilatation LDL remains elevated.   Her last LDL ws 179 CAC score in July, 2022 was 73.2 ( 76th percentile for  age/ sex matched controls )  BP is ok at home  Is exercising regularly ,   No CP , no dyspnea   Went skydiving recently   Lipids are very elevated.  LDL was 179. CAC score is 73.      Past Medical History:  Diagnosis Date   Headache(784.0)    Kidney stones    passed stone - no surgery   PONV (postoperative nausea and vomiting)    SVD (spontaneous vaginal delivery)    x 3    Past Surgical History:  Procedure Laterality Date   ABDOMINAL HYSTERECTOMY     BACK SURGERY     L4-l5   COLONOSCOPY     CYSTOCELE REPAIR N/A 05/29/2014   Procedure: ANTERIOR REPAIR (CYSTOCELE) with wide  local excision;  Surgeon: Thurnell Lose, MD;  Location: [facility];  Service: Gynecology;  Laterality: N/A;   JOINT REPLACEMENT     left knee   TUBAL LIGATION      Current Medications: Current Meds  Medication Sig   chlorthalidone (HYGROTON) 25 MG tablet Take 1 tablet (25 mg total) by  mouth daily.   Citrus Bergamot 650 MG TABS Take 1,400 mg by mouth daily in the afternoon.   Coenzyme Q10 (COQ10) 100 MG CAPS Take 100 mg by mouth daily in the afternoon.   CYSTEINE PO Take 600 mg by mouth daily.   Melatonin 10 MG/ML LIQD Take 2 mLs by mouth daily.   Menaquinone-7 (VITAMIN K2 PO) Take 1 tablet by mouth daily.   NON FORMULARY Isagenix products   NON FORMULARY Juice Plus - contains omega blend, fruit blend, berry blend, and vegetable blend   potassium chloride (KLOR-CON) 10 MEQ tablet Take 1 tablet (10 mEq total) by mouth daily. Please call [phone] to schedule an appointment for November for future refills. Thank you.   silver sulfADIAZINE (SILVADENE) 1 % cream Apply 1 application. topically 2 (two) times daily as needed. Clean site of apply directly to burn wound and apply cream.   SUMAtriptan (IMITREX) 100 MG tablet Take 100 mg by mouth every 2 (two) hours as needed for migraine or headache. May repeat in 2 hours if headache persists or recurs.   Turmeric 500 MG CAPS Take 1 tablet by mouth daily.  Contains turmeric 300mg , ginger 50mg , and black pepper 10mg    valsartan (DIOVAN) 80 MG tablet Take 1 tablet (80 mg total) by mouth daily.     Allergies:   Aleve [naproxen sodium], Mobic [meloxicam], and Rosuvastatin   Social History   Socioeconomic History   Marital status: Widowed    Spouse name: Not on file   Number of children: Not on file   Years of education: Not on file   Highest education level: Not on file  Occupational History   Not on file  Tobacco Use   Smoking status: Never   Smokeless tobacco: Never  Vaping Use   Vaping Use: Never used  Substance and Sexual Activity   Alcohol use: No   Drug use: No   Sexual activity: Yes    Birth control/protection: Surgical  Other Topics Concern   Not on file  Social History Narrative   Not on file   Social Determinants of Health   Financial Resource Strain: Not on file  Food Insecurity: Not on file  Transportation Needs: Not on file  Physical Activity: Not on file  Stress: Not on file  Social Connections: Not on file     Family History: The patient's family history includes Hypertension in her father.  ROS:   Please see the history of present illness.     All other systems reviewed and are negative.  EKGs/Labs/Other Studies Reviewed:    The following studies were reviewed today:   EKG:   December 27, 2022: Normal sinus rhythm at 70.  Normal EKG.  Recent Labs: No results found for requested labs within last 365 days.  Recent Lipid Panel    Component Value Date/Time   CHOL 263 (H) 05/31/2022 0757   TRIG 143 05/31/2022 0757   HDL 58 05/31/2022 0757   CHOLHDL 4.5 (H) 05/31/2022 0757   LDLCALC 179 (H) 05/31/2022 0757     Risk Assessment/Calculations:           Physical Exam:    Physical Exam: Blood pressure 128/78, pulse 70, height 5\' 3"  (1.6 m), weight 183 lb 3.2 oz (83.1 kg), SpO2 97 %.       GEN:  Well nourished, well developed in no acute distress HEENT: Normal NECK: No JVD; No carotid  bruits LYMPHATICS: No lymphadenopathy CARDIAC: RRR , no murmurs, rubs, gallops RESPIRATORY:  Clear to auscultation without rales, wheezing or rhonchi  ABDOMEN: Soft, non-tender, non-distended MUSCULOSKELETAL:  No edema; No deformity  SKIN: Warm and dry NEUROLOGIC:  Alert and oriented x 3    ASSESSMENT:    No diagnosis found.   PLAN:       HTN:    Blood pressure is well-controlled.  2.  Mild dilatation of her ascending aorta:      3.  Hyperlipidemia: She has a markedly elevated LDL at 195.   CAC is 73.    She does not want to start any new medicines.  We discussed some of the injectable PCSK9 inhibitors and inclisiran.  She does not think that she could do this.  She wants to try to fight this naturally.  Will continue to see her yearly.           Medication Adjustments/Labs and Tests Ordered: Current medicines are reviewed at length with the patient today.  Concerns regarding medicines are outlined above.  No orders of the defined types were placed in this encounter.    No orders of the defined types were placed in this encounter.     There are no Patient Instructions on file for this visit.   Signed, Mertie Moores, MD  12/27/2022 3:28 PM    [place]

## 2022-12-27 ENCOUNTER — Ambulatory Visit: Payer: Medicare HMO | Attending: Cardiovascular Disease | Admitting: Cardiovascular Disease

## 2022-12-27 ENCOUNTER — Encounter: Payer: Self-pay | Admitting: Cardiovascular Disease

## 2022-12-27 VITALS — BP 128/78 | HR 70 | Ht 63.0 in | Wt 183.2 lb

## 2022-12-27 DIAGNOSIS — E782 Mixed hyperlipidemia: Secondary | ICD-10-CM | POA: Diagnosis not present

## 2022-12-27 DIAGNOSIS — I10 Essential (primary) hypertension: Secondary | ICD-10-CM | POA: Diagnosis not present

## 2022-12-27 NOTE — Patient Instructions (Signed)
Medication Instructions:  Your physician recommends that you continue on your current medications as directed. Please refer to the Current Medication list given to you today.  *If you need a refill on your cardiac medications before your next appointment, please call your pharmacy*   Lab Work: Lipids, BMET, LP(a) today If you have labs (blood work) drawn today and your tests are completely normal, you will receive your results only by: [place] (if you have MyChart) OR A paper copy in the mail If you have any lab test that is abnormal or we need to change your treatment, we will call you to review the results.   Testing/Procedures: NONE   Follow-Up: At [facility], you and your health needs are our priority.  As part of our continuing mission to provide you with exceptional heart care, we have created designated Provider Care Teams.  These Care Teams include your primary Cardiologist (physician) and Advanced Practice Providers (APPs -  Physician Assistants and Nurse Practitioners) who all work together to provide you with the care you need, when you need it.  We recommend signing up for the patient portal called "MyChart".  Sign up information is provided on this After Visit Summary.  MyChart is used to connect with patients for Virtual Visits (Telemedicine).  Patients are able to view lab/test results, encounter notes, upcoming appointments, etc.  Non-urgent messages can be sent to your provider as well.   To learn more about what you can do with MyChart, go to [email or web address].    Your next appointment:   1 year(s)  Provider:   Mertie Moores, MD

## 2022-12-28 LAB — LIPID PANEL
Chol/HDL Ratio: 4.4 ratio (ref 0.0–4.4)
Cholesterol, Total: 272 mg/dL — ABNORMAL HIGH (ref 100–199)
HDL: 62 mg/dL (ref >39)
LDL Chol Calc (NIH): 186 mg/dL — ABNORMAL HIGH (ref 0–99)
Triglycerides: 132 mg/dL (ref 0–149)
VLDL Cholesterol Cal: 24 mg/dL (ref 5–40)

## 2022-12-28 LAB — BASIC METABOLIC PANEL
BUN/Creatinine Ratio: 21 (ref 12–28)
BUN: 26 mg/dL (ref 8–27)
CO2: 25 mmol/L (ref 20–29)
Calcium: 9.7 mg/dL (ref 8.7–10.3)
Chloride: 97 mmol/L (ref 96–106)
Creatinine, Ser: 1.22 mg/dL — ABNORMAL HIGH (ref 0.57–1.00)
Glucose: 114 mg/dL — ABNORMAL HIGH (ref 70–99)
Potassium: 3.7 mmol/L (ref 3.5–5.2)
Sodium: 137 mmol/L (ref 134–144)
eGFR: 49 mL/min/{1.73_m2} — ABNORMAL LOW (ref >59)

## 2022-12-28 LAB — LIPOPROTEIN A (LPA): Lipoprotein (a): 211.1 nmol/L — ABNORMAL HIGH (ref <75.0)

## 2023-01-05 DIAGNOSIS — M5441 Lumbago with sciatica, right side: Secondary | ICD-10-CM | POA: Diagnosis not present

## 2023-01-05 DIAGNOSIS — M25551 Pain in right hip: Secondary | ICD-10-CM | POA: Diagnosis not present

## 2023-01-18 ENCOUNTER — Other Ambulatory Visit: Payer: Self-pay | Admitting: Cardiovascular Disease

## 2023-01-18 DIAGNOSIS — Z79899 Other long term (current) drug therapy: Secondary | ICD-10-CM

## 2023-01-18 DIAGNOSIS — I10 Essential (primary) hypertension: Secondary | ICD-10-CM

## 2023-02-01 DIAGNOSIS — H35341 Macular cyst, hole, or pseudohole, right eye: Secondary | ICD-10-CM | POA: Diagnosis not present

## 2023-02-01 DIAGNOSIS — H2513 Age-related nuclear cataract, bilateral: Secondary | ICD-10-CM | POA: Diagnosis not present

## 2023-02-07 DIAGNOSIS — H2513 Age-related nuclear cataract, bilateral: Secondary | ICD-10-CM | POA: Diagnosis not present

## 2023-02-07 DIAGNOSIS — H35371 Puckering of macula, right eye: Secondary | ICD-10-CM | POA: Diagnosis not present

## 2023-02-07 DIAGNOSIS — H35341 Macular cyst, hole, or pseudohole, right eye: Secondary | ICD-10-CM | POA: Diagnosis not present

## 2023-03-15 DIAGNOSIS — H35341 Macular cyst, hole, or pseudohole, right eye: Secondary | ICD-10-CM | POA: Diagnosis not present

## 2023-03-15 DIAGNOSIS — H04123 Dry eye syndrome of bilateral lacrimal glands: Secondary | ICD-10-CM | POA: Diagnosis not present

## 2023-03-15 DIAGNOSIS — H43813 Vitreous degeneration, bilateral: Secondary | ICD-10-CM | POA: Diagnosis not present

## 2023-03-15 DIAGNOSIS — H2513 Age-related nuclear cataract, bilateral: Secondary | ICD-10-CM | POA: Diagnosis not present

## 2023-03-15 DIAGNOSIS — H35371 Puckering of macula, right eye: Secondary | ICD-10-CM | POA: Diagnosis not present

## 2023-03-16 ENCOUNTER — Encounter: Payer: Self-pay | Admitting: Cardiovascular Disease

## 2023-03-16 DIAGNOSIS — R931 Abnormal findings on diagnostic imaging of heart and coronary circulation: Secondary | ICD-10-CM

## 2023-03-16 DIAGNOSIS — E782 Mixed hyperlipidemia: Secondary | ICD-10-CM

## 2023-03-16 DIAGNOSIS — I10 Essential (primary) hypertension: Secondary | ICD-10-CM

## 2023-03-18 ENCOUNTER — Ambulatory Visit: Payer: Medicare HMO | Attending: Cardiovascular Disease

## 2023-03-18 DIAGNOSIS — E782 Mixed hyperlipidemia: Secondary | ICD-10-CM

## 2023-03-18 DIAGNOSIS — I10 Essential (primary) hypertension: Secondary | ICD-10-CM

## 2023-03-18 DIAGNOSIS — R931 Abnormal findings on diagnostic imaging of heart and coronary circulation: Secondary | ICD-10-CM

## 2023-03-19 LAB — BASIC METABOLIC PANEL
BUN: 21 mg/dL (ref 8–27)
Calcium: 9.9 mg/dL (ref 8.7–10.3)
Potassium: 3.7 mmol/L (ref 3.5–5.2)

## 2023-03-19 LAB — LIPID PANEL
Cholesterol, Total: 268 mg/dL — ABNORMAL HIGH (ref 100–199)
HDL: 56 mg/dL (ref >39)
LDL Chol Calc (NIH): 192 mg/dL — ABNORMAL HIGH (ref 0–99)
Triglycerides: 114 mg/dL (ref 0–149)

## 2023-03-19 LAB — LIPOPROTEIN A (LPA)

## 2023-03-21 ENCOUNTER — Encounter: Payer: Self-pay | Admitting: Cardiovascular Disease

## 2023-03-21 DIAGNOSIS — Z79899 Other long term (current) drug therapy: Secondary | ICD-10-CM

## 2023-03-21 DIAGNOSIS — R931 Abnormal findings on diagnostic imaging of heart and coronary circulation: Secondary | ICD-10-CM

## 2023-03-21 DIAGNOSIS — E782 Mixed hyperlipidemia: Secondary | ICD-10-CM

## 2023-03-21 LAB — BASIC METABOLIC PANEL
BUN/Creatinine Ratio: 15 (ref 12–28)
CO2: 22 mmol/L (ref 20–29)
Chloride: 103 mmol/L (ref 96–106)
Creatinine, Ser: 1.36 mg/dL — ABNORMAL HIGH (ref 0.57–1.00)
Glucose: 91 mg/dL (ref 70–99)
Sodium: 141 mmol/L (ref 134–144)
eGFR: 42 mL/min/{1.73_m2} — ABNORMAL LOW (ref >59)

## 2023-03-21 LAB — LIPID PANEL
Chol/HDL Ratio: 4.8 ratio — ABNORMAL HIGH (ref 0.0–4.4)
VLDL Cholesterol Cal: 20 mg/dL (ref 5–40)

## 2023-03-30 IMAGING — CT CT CARDIAC CORONARY ARTERY CALCIUM SCORE
3 series · 14 of 20 positions shown, 16 images · non-contrast
Comparison: None.
COMPARISON: None.

Addendum:
EXAM:
OVER-READ INTERPRETATION  CT CHEST

The following report is an over-read performed by radiologist Dr.
Ranem Bawadkji [REDACTED] on 10/30/2021. This
over-read does not include interpretation of cardiac or coronary
anatomy or pathology. The coronary calcium score interpretation by
the cardiologist is attached.
CLINICAL DATA: 65F for cardiovascular disease risk stratification
Coronary Calcium Score
TECHNIQUE: A gated, non-contrast computed tomography scan of the heart was
performed using 3mm slice thickness. Axial images were analyzed on a
dedicated workstation. Calcium scoring of the coronary arteries was
performed using the Agatston method.

[Series 2: cascseq 2.0 sa36 70% (id) · axial · 0.39mm/px · z∈[-214,-134]mm · 4 of 68 slices shown]
[im 14/68  vessel]
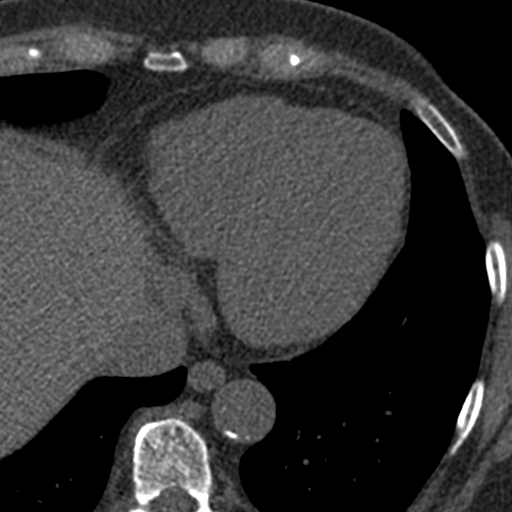
[im 27/68  vessel]
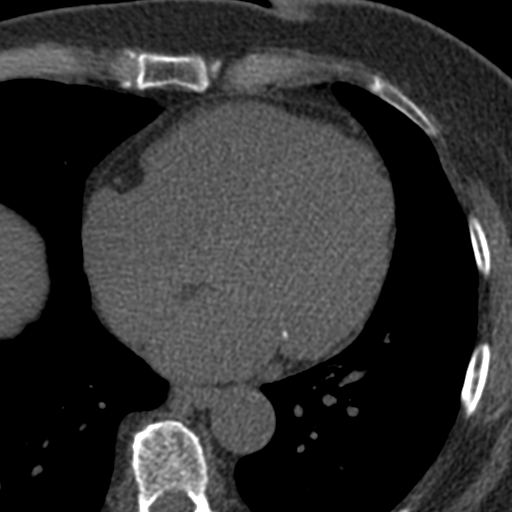
[im 41/68  vessel]
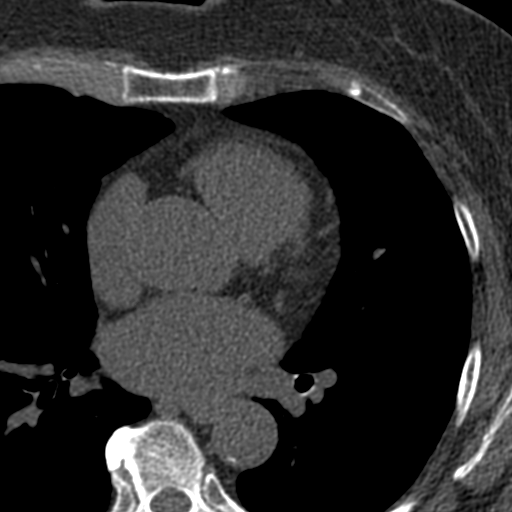
[im 54/68  vessel]
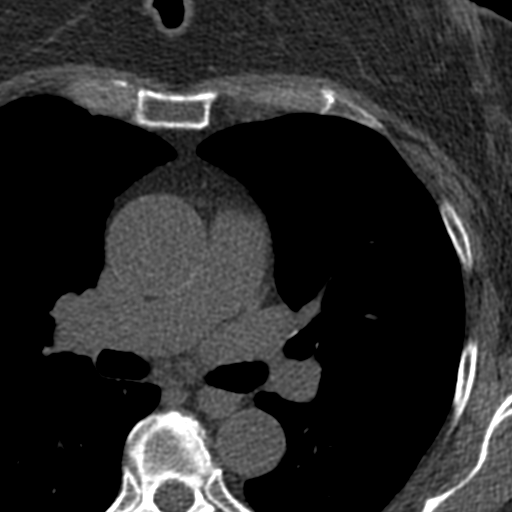

[Series 3: cascseq 2.0 bf37 st · axial · 0.71mm/px · z∈[-218,-130]mm · 5 of 68 slices shown, 7 images]
[im 12/68  vessel]
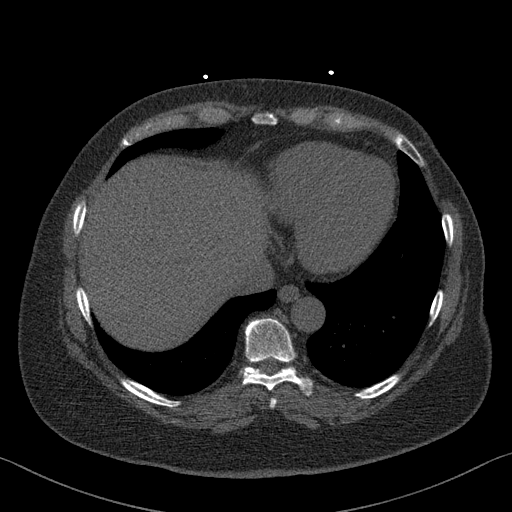
[im 12/68  lung]
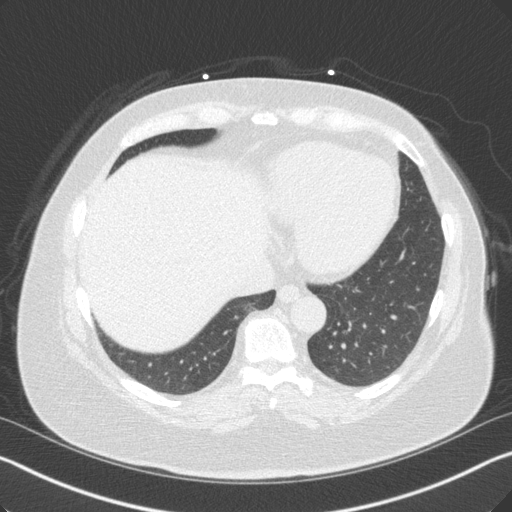
[im 23/68  vessel]
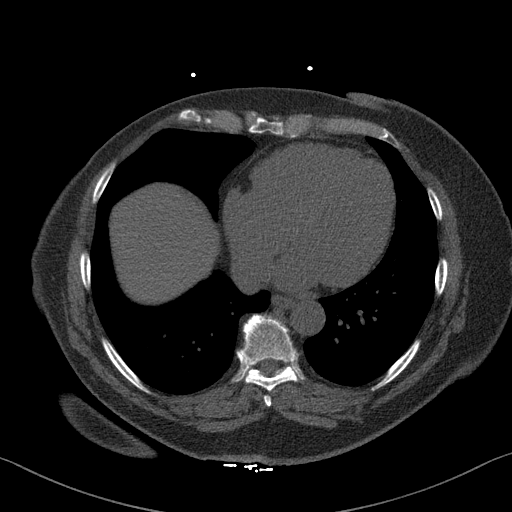
[im 34/68  vessel]
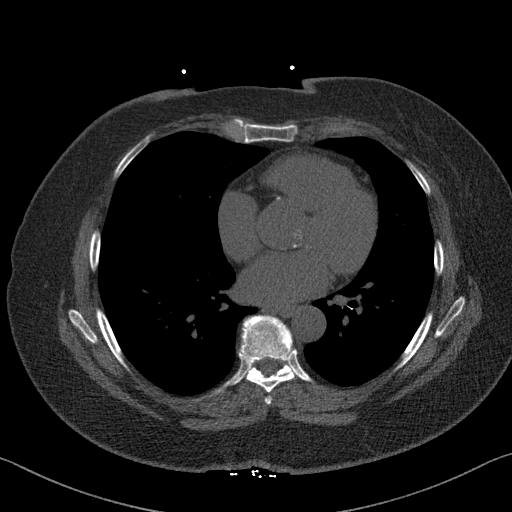
[im 45/68  vessel]
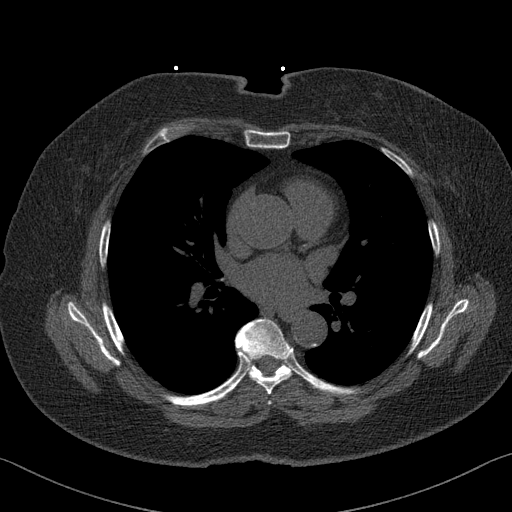
[im 56/68  vessel]
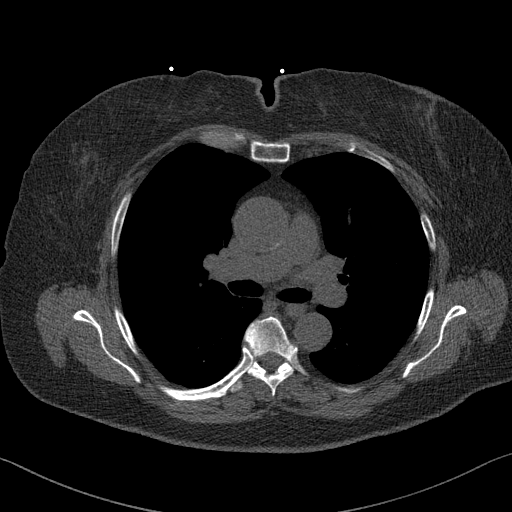
[im 56/68  lung]
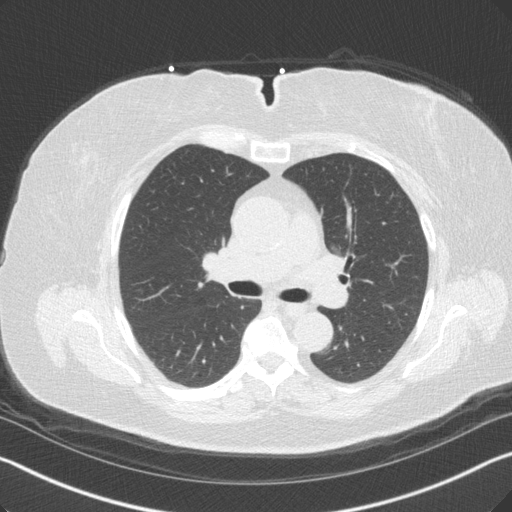

[Series 4: cascseq 2.0 br59 lung · axial · 0.71mm/px · z∈[-218,-130]mm · 5 of 68 slices shown]
[im 12/68  lung]
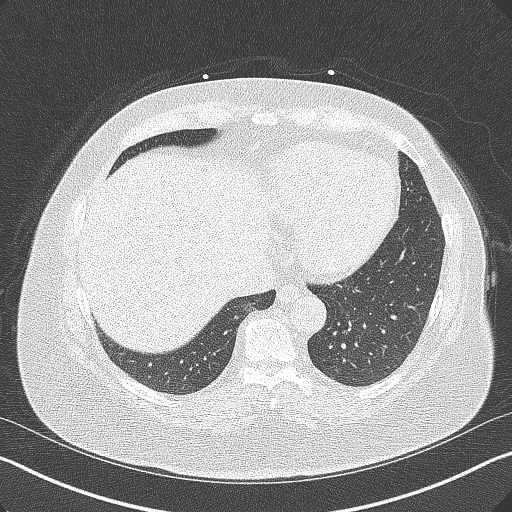
[im 23/68  lung]
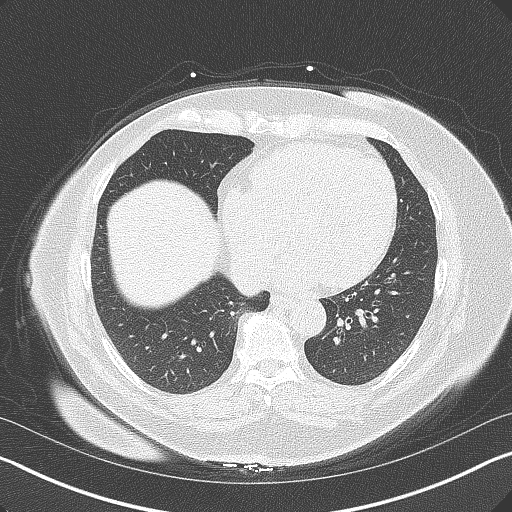
[im 34/68  lung]
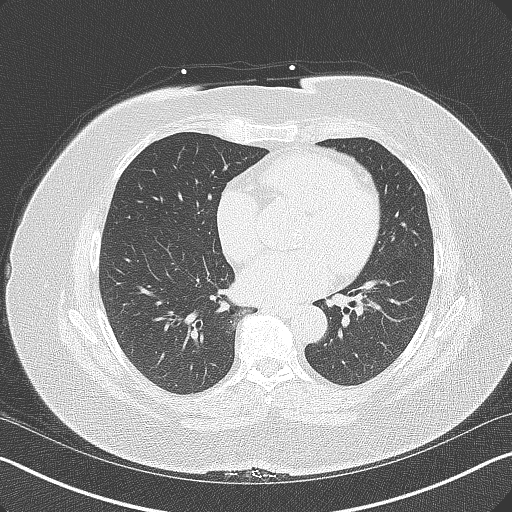
[im 45/68  lung]
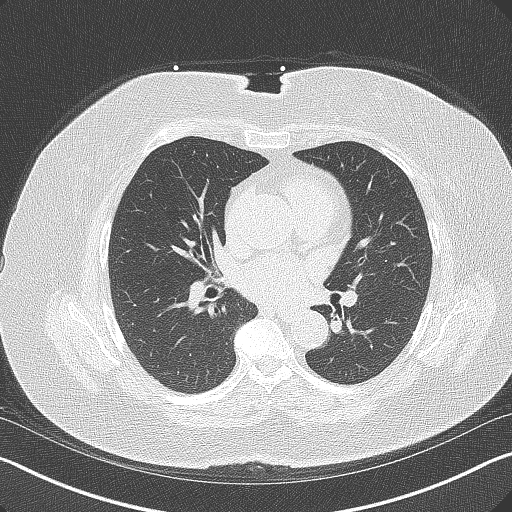
[im 56/68  lung]
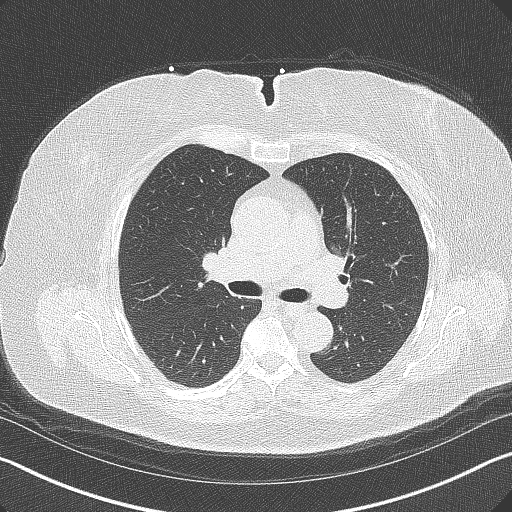

[14 of 20 positions shown; findings below may reference images not displayed]

FINDINGS: Atherosclerotic calcifications in the thoracic aorta. Within the
visualized portions of the thorax there are no suspicious appearing
pulmonary nodules or masses, there is no acute consolidative
airspace disease, no pleural effusions, no pneumothorax and no
lymphadenopathy. Visualized portions of the upper abdomen are
unremarkable. There are no aggressive appearing lytic or blastic
lesions noted in the visualized portions of the skeleton.
IMPRESSION: 1.  Aortic Atherosclerosis (9QHTM-68S.S).
FINDINGS: Coronary arteries: Normal origins.

Coronary Calcium Score:

Left main:

Left anterior descending artery:

Left circumflex artery: 0

Right coronary artery:

Total:

Percentile: 76th

Pericardium: Normal.

Mitral annular calcification.

Ascending Aorta: Normal caliber.  Aortic atherosclerosis.

Non-cardiac: See separate report from [REDACTED].
IMPRESSION: Coronary calcium score of 73.2. This was 76th percentile for age-,
race-, and sex-matched controls.



If CAC=0, it is reasonable to withhold statin therapy and reassess
in 5 to 10 years, as long as higher risk conditions are absent
(diabetes mellitus, family history of premature CHD in first degree
relatives (males <55 years; females <65 years), cigarette smoking,
or LDL >=190 mg/dL).

If CAC is 1 to 99, it is reasonable to initiate statin therapy for
patients >=55 years of age.

If CAC is >=100 or >=75th percentile, it is reasonable to initiate
statin therapy at any age.

Cardiology referral should be considered for patients with CAC
scores >=400 or >=75th percentile.

*5867 AHA/ACC/AACVPR/AAPA/ABC/LIMA/MUNOZ/LUMBOY/Duy/LUND/EBADAT/KRIZ
Guideline on the Management of Blood Cholesterol: A Report of the
American College of Cardiology/American Heart Association Task Force
on Clinical Practice Guidelines. J Am Coll Cardiol.
6589;73(24):5894-5157.

*** End of Addendum ***
EXAM:
OVER-READ INTERPRETATION  CT CHEST

The following report is an over-read performed by radiologist Dr.
Ranem Bawadkji [REDACTED] on 10/30/2021. This
over-read does not include interpretation of cardiac or coronary
anatomy or pathology. The coronary calcium score interpretation by
the cardiologist is attached.
FINDINGS: Atherosclerotic calcifications in the thoracic aorta. Within the
visualized portions of the thorax there are no suspicious appearing
pulmonary nodules or masses, there is no acute consolidative
airspace disease, no pleural effusions, no pneumothorax and no
lymphadenopathy. Visualized portions of the upper abdomen are
unremarkable. There are no aggressive appearing lytic or blastic
lesions noted in the visualized portions of the skeleton.
IMPRESSION: 1.  Aortic Atherosclerosis (9QHTM-68S.S).

## 2023-04-01 ENCOUNTER — Other Ambulatory Visit: Payer: Self-pay | Admitting: Obstetrics and Gynecology

## 2023-04-01 DIAGNOSIS — Z01419 Encounter for gynecological examination (general) (routine) without abnormal findings: Secondary | ICD-10-CM | POA: Diagnosis not present

## 2023-04-01 DIAGNOSIS — K649 Unspecified hemorrhoids: Secondary | ICD-10-CM | POA: Diagnosis not present

## 2023-04-01 DIAGNOSIS — Z9189 Other specified personal risk factors, not elsewhere classified: Secondary | ICD-10-CM | POA: Diagnosis not present

## 2023-04-01 DIAGNOSIS — N952 Postmenopausal atrophic vaginitis: Secondary | ICD-10-CM | POA: Diagnosis not present

## 2023-04-01 DIAGNOSIS — Z87412 Personal history of vulvar dysplasia: Secondary | ICD-10-CM | POA: Diagnosis not present

## 2023-04-01 DIAGNOSIS — Z0000 Encounter for general adult medical examination without abnormal findings: Secondary | ICD-10-CM

## 2023-04-06 ENCOUNTER — Ambulatory Visit
Admission: RE | Admit: 2023-04-06 | Discharge: 2023-04-06 | Disposition: A | Discharge status: Home / Self Care | Payer: Medicare HMO | Source: Ambulatory Visit | Attending: Obstetrics and Gynecology | Admitting: Obstetrics and Gynecology

## 2023-04-06 DIAGNOSIS — Z0000 Encounter for general adult medical examination without abnormal findings: Secondary | ICD-10-CM

## 2023-04-06 DIAGNOSIS — Z1231 Encounter for screening mammogram for malignant neoplasm of breast: Secondary | ICD-10-CM | POA: Diagnosis not present

## 2023-04-21 ENCOUNTER — Other Ambulatory Visit: Payer: Self-pay | Admitting: Cardiovascular Disease

## 2023-04-27 DIAGNOSIS — Z79899 Other long term (current) drug therapy: Secondary | ICD-10-CM | POA: Diagnosis not present

## 2023-04-27 DIAGNOSIS — H35341 Macular cyst, hole, or pseudohole, right eye: Secondary | ICD-10-CM | POA: Diagnosis not present

## 2023-08-04 ENCOUNTER — Encounter: Payer: Self-pay | Admitting: Cardiovascular Disease

## 2023-08-26 ENCOUNTER — Ambulatory Visit (HOSPITAL_BASED_OUTPATIENT_CLINIC_OR_DEPARTMENT_OTHER): Payer: Medicare HMO | Admitting: Internal Medicine

## 2023-08-26 ENCOUNTER — Encounter (HOSPITAL_BASED_OUTPATIENT_CLINIC_OR_DEPARTMENT_OTHER): Payer: Self-pay | Admitting: Internal Medicine

## 2023-08-26 VITALS — BP 136/70 | HR 64 | Ht 62.0 in | Wt 165.0 lb

## 2023-08-26 DIAGNOSIS — M791 Myalgia, unspecified site: Secondary | ICD-10-CM | POA: Diagnosis not present

## 2023-08-26 DIAGNOSIS — R931 Abnormal findings on diagnostic imaging of heart and coronary circulation: Secondary | ICD-10-CM

## 2023-08-26 DIAGNOSIS — T466X5A Adverse effect of antihyperlipidemic and antiarteriosclerotic drugs, initial encounter: Secondary | ICD-10-CM | POA: Diagnosis not present

## 2023-08-26 DIAGNOSIS — E7800 Pure hypercholesterolemia, unspecified: Secondary | ICD-10-CM | POA: Diagnosis not present

## 2023-08-26 DIAGNOSIS — E7841 Elevated Lipoprotein(a): Secondary | ICD-10-CM

## 2023-08-26 NOTE — Patient Instructions (Signed)
Medication Instructions:  Dr. Rennis Golden has recommended an injectable medication called LEQVIO. This is administered by a health care provider. The frequency of injections is TWO injections given 3 months apart (loading dose) and then every 6 months after that. The injection appointments at [facility] ([address] [place], [place]  [address]). Once we have the benefits check information, we will reach out to let you know if the medication is covered 100%, if there is a deductible, co-insurance, out-of-pocket max. From there, we will see if you need patient assistance and our team will take care of working on this. Because of the frequency schedule of this medication, your follow up/repeat cholesterol lab work will be about 5-6 months from now.   *If you need a refill on your cardiac medications before your next appointment, please call your pharmacy*   Lab Work: FASTING lab work to check cholesterol in 5-6 months  If you have labs (blood work) drawn today and your tests are completely normal, you will receive your results only by: MyChart Message (if you have MyChart) OR A paper copy in the mail If you have any lab test that is abnormal or we need to change your treatment, we will call you to review the results.   Follow-Up: At [facility], you and your health needs are our priority.  As part of our continuing mission to provide you with exceptional heart care, we have created designated Provider Care Teams.  These Care Teams include your primary Cardiologist (physician) and Advanced Practice Providers (APPs -  Physician Assistants and Nurse Practitioners) who all work together to provide you with the care you need, when you need it.  We recommend signing up for the patient portal called "MyChart".  Sign up information is provided on this After Visit Summary.  MyChart is used to connect with patients for Virtual Visits (Telemedicine).  Patients are able to view  lab/test results, encounter notes, upcoming appointments, etc.  Non-urgent messages can be sent to your provider as well.   To learn more about what you can do with MyChart, go to [email or web address].    Your next appointment:    5-6 months with Dr. Rennis Golden

## 2023-08-26 NOTE — Progress Notes (Signed)
LIPID CLINIC CONSULT NOTE  Chief Complaint:  Elevated LP(a)  Primary Care Physician: Bridget Raider, MD  Primary Cardiologist:  Bridget Miss, MD  HPI:  Bridget Salas is a 67 y.o. female who is being seen today for the evaluation of elevated LP(a) at the request of Nahser, Deloris Ping, MD. this is a 67 year old female kindly referred for evaluation of elevated LP(a).  She has a longstanding history of high cholesterol with most recent lipid profile in our system showing total cholesterol 268, triglycerides 114, HDL 56 and LDL 192.  Her LP(a) was assessed earlier this year and was elevated 211 nmol/L.  She had investigated a clinical research trial to lower this but was not considered a candidate.  Subsequently she had repeat LP(a) testing for unknown reasons and her LP(a) was now 649 nmol/L.  This is extremely elevated and seems unusual to have increased so much.  There is actually a fairly small amount of literature on this.  There are some investigators that believe there is about 20% of LP(a) that may be linked to not genetic factors but generally the LP(a) is stable for most patients.  Those nongenetic factors could include things like hyper or hypothyroidism, chronic kidney disease including of nephrotic syndrome and other factors.  She has not been on statin therapy due to side effects with rosuvastatin in the past which caused muscle pain.  She currently is on red yeast rice and a number of supplements which she seems to tolerate.  She did have an abnormal calcium score 73.2, 76 percentile for age and sex matched controls.  She is lost a significant amount of weight however her cholesterol still remains elevated.  She does have a number of concerns and hesitancy about using new medications and particularly injectables.  She is also inquiring about any upcoming clinical trials.  PMHx:  Past Medical History:  Diagnosis Date   Headache(784.0)    Kidney stones    passed stone - no surgery    PONV (postoperative nausea and vomiting)    SVD (spontaneous vaginal delivery)    x 3    Past Surgical History:  Procedure Laterality Date   ABDOMINAL HYSTERECTOMY     BACK SURGERY     L4-l5   COLONOSCOPY     CYSTOCELE REPAIR N/A 05/29/2014   Procedure: ANTERIOR REPAIR (CYSTOCELE) with wide  local excision;  Surgeon: Bridget Rankins, MD;  Location: [facility];  Service: Gynecology;  Laterality: N/A;   JOINT REPLACEMENT     left knee   TUBAL LIGATION      FAMHx:  Family History  Problem Relation Age of Onset   Hypertension Father     SOCHx:   reports that she has never smoked. She has never used smokeless tobacco. She reports that she does not drink alcohol and does not use drugs.  ALLERGIES:  Allergies  Allergen Reactions   Aleve [Naproxen Sodium]     Pt stated, "gave me sores in my mouth"   Mobic [Meloxicam] Hives   Rosuvastatin     Other reaction(s): muscle pain    ROS: Pertinent items noted in HPI and remainder of comprehensive ROS otherwise negative.  HOME MEDS: Current Outpatient Medications on File Prior to Visit  Medication Sig Dispense Refill   Ascorbic Acid 100 MG CHEW 1 tablet Orally Once a day     chlorthalidone (HYGROTON) 25 MG tablet Take 1 tablet (25 mg total) by mouth daily. 90 tablet 1   Coenzyme Q10 (Q-SORB  CO Q-10) 100 MG capsule Take 200 mg by mouth daily.     CYSTEINE PO Take 600 mg by mouth daily.     Melatonin 10 MG/ML LIQD Take 2 mLs by mouth daily.     Menaquinone-7 (VITAMIN K2 PO) Take 1 tablet by mouth daily.     NON FORMULARY Isagenix products     potassium chloride (KLOR-CON) 10 MEQ tablet Take 1 tablet (10 mEq total) by mouth daily. 90 tablet 2   silver sulfADIAZINE (SILVADENE) 1 % cream Apply 1 application. topically 2 (two) times daily as needed. Clean site of apply directly to burn wound and apply cream. 50 g 0   SUMAtriptan (IMITREX) 100 MG tablet Take 100 mg by mouth every 2 (two) hours as needed for migraine or headache. May repeat in  2 hours if headache persists or recurs.     valsartan (DIOVAN) 80 MG tablet TAKE 1 TABLET BY MOUTH EVERY DAY 90 tablet 3   Zinc 50 MG TABS 1 tablet Orally Once a day     No current facility-administered medications on file prior to visit.    LABS/IMAGING: No results found for this or any previous visit (from the past 48 hour(s)). No results found.  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  Unknown as to why CAC score 73.2, 76 percentile (10/2021) Statin intolerance-myalgias, on red yeast rice  PLAN: 1.   Bridget Salas has a high LP(a).  We discussed possible therapies that may lower this including PCSK9 inhibitors.  There is no clear role for niacin.  It is unclear why her LP(a) went up significantly after repeat check.  There could be some factors associated with this including advanced kidney disease which she does not have or possibly hyper or hypothyroidism.  She has not had a TSH since 2022 according to our records which was normal.  We may wish to repeat that.  Otherwise I talked about options including the antibody PCSK9 inhibitors or Leqvio.  Because of her concerns about injecting herself, [name] may be a better option and it is also less frequent but may be more costly.  She is okay for [place] to go ahead and do a benefits evaluation for that.  Alternatively she might be a candidate for clinical trials.  Specifically when  looking at more of a primary prevention clinical trial regarding LP(a) which she might qualify for.  If approved and she starts on Leqvio therapy then we will plan to follow-up in about 6 months with repeat labs at that time.  Thanks again for the kind referral.  Chrystie Nose, MD, [facility]  [place]  [facility]  Medical Director of the Advanced Lipid Disorders &  Cardiovascular Risk Reduction Clinic Diplomate of the American Board of Clinical Lipidology  Attending Cardiologist  Direct Dial: [phone]  Fax: [phone]  Website:  [email or web address] 08/26/2023, 12:38 PM

## 2023-08-31 ENCOUNTER — Telehealth: Payer: Self-pay | Admitting: Internal Medicine

## 2023-08-31 NOTE — Telephone Encounter (Signed)
Patient has been identified as candidate for Leqvio  Benefits investigation enrollment completed on 08/30/23  Benefits investigation report notes the following:  Type of insurance: [employer] Medicare  OOP Max: $4500 / Met: $538.55  Deductible: N/A  Co-insurance: 20%  PA required: YES  PA phone number: not provided  Benefits Summary Details:  20% co-insurance Once OOP max is met, Bridget Salas is covered at 100%

## 2023-08-31 NOTE — Telephone Encounter (Signed)
MyChart message sent to patient w/update on Leqvio Referral to [facility] ordered

## 2023-09-02 ENCOUNTER — Telehealth: Payer: Self-pay

## 2023-09-02 ENCOUNTER — Other Ambulatory Visit: Payer: Self-pay | Admitting: Cardiovascular Disease

## 2023-09-02 NOTE — Telephone Encounter (Signed)
Dr. Rennis Golden and Eileen Stanford, Please note, the patient has been approved for [facility]. She will be scheduled as soon as possible.  Thank you  Auth Submission: APPROVED Site of care: Site of care: [facility] Payer: [employer] medicare Medication & CPT/J Code(s) submitted: Leqvio (Inclisiran) [id number] Route of submission (phone, fax, portal): portal Phone # Fax # Auth type: Buy/Bill PB Units/visits requested: 284mg  x 2 doses Reference number: [id number] Approval from: 08/31/23 to 02/29/24

## 2023-09-08 NOTE — Telephone Encounter (Signed)
Leqvio injection scheduled 09/13/23

## 2023-09-12 ENCOUNTER — Telehealth: Payer: Self-pay | Admitting: Internal Medicine

## 2023-09-12 ENCOUNTER — Institutional Professional Consult (permissible substitution) (HOSPITAL_BASED_OUTPATIENT_CLINIC_OR_DEPARTMENT_OTHER): Payer: Medicare HMO | Admitting: Internal Medicine

## 2023-09-12 DIAGNOSIS — H2513 Age-related nuclear cataract, bilateral: Secondary | ICD-10-CM | POA: Diagnosis not present

## 2023-09-12 DIAGNOSIS — H35341 Macular cyst, hole, or pseudohole, right eye: Secondary | ICD-10-CM | POA: Diagnosis not present

## 2023-09-12 DIAGNOSIS — H35372 Puckering of macula, left eye: Secondary | ICD-10-CM | POA: Diagnosis not present

## 2023-09-12 MED ORDER — LEQVIO 284 MG/1.5ML ~~LOC~~ SOSY: 284.0000 mg | PREFILLED_SYRINGE | Freq: Once | SUBCUTANEOUS | 0 refills | Status: AC

## 2023-09-12 NOTE — Telephone Encounter (Signed)
  Pt c/o medication issue:  1. Name of Medication: Leqvio  2. How are you currently taking this medication (dosage and times per day)?   3. Are you having a reaction (difficulty breathing--STAT)?   4. What is your medication issue? Chei with cover [employer] med[employer] calling, he said, pt is approved for pt assistance and they need prescription from Dr. Rennis Golden. He said, they can get verbal prescription just call  [phone] option 3. Or it can be fax to [phone]. He said, the prescription should be sent to [employer] in [place] only.

## 2023-09-12 NOTE — Telephone Encounter (Signed)
Rx sent to cover[facility]eds in [place]

## 2023-09-13 ENCOUNTER — Ambulatory Visit: Payer: Medicare HMO

## 2023-09-14 NOTE — Telephone Encounter (Signed)
Bridget Salas, F/u:  Patient has been approved for NPAF and has been scheduled for 09/22/23  Thanks Selena Batten

## 2023-09-15 DIAGNOSIS — H35341 Macular cyst, hole, or pseudohole, right eye: Secondary | ICD-10-CM | POA: Diagnosis not present

## 2023-09-15 DIAGNOSIS — H2513 Age-related nuclear cataract, bilateral: Secondary | ICD-10-CM | POA: Diagnosis not present

## 2023-09-15 DIAGNOSIS — H04123 Dry eye syndrome of bilateral lacrimal glands: Secondary | ICD-10-CM | POA: Diagnosis not present

## 2023-09-15 DIAGNOSIS — H35372 Puckering of macula, left eye: Secondary | ICD-10-CM | POA: Diagnosis not present

## 2023-09-22 ENCOUNTER — Ambulatory Visit: Payer: Medicare HMO

## 2023-09-22 VITALS — BP 139/74 | HR 55 | Temp 97.3°F | Resp 16 | Ht 62.0 in | Wt 169.4 lb

## 2023-09-22 DIAGNOSIS — E782 Mixed hyperlipidemia: Secondary | ICD-10-CM

## 2023-09-22 MED ORDER — INCLISIRAN SODIUM 284 MG/1.5ML ~~LOC~~ SOSY
284.0000 mg | PREFILLED_SYRINGE | Freq: Once | SUBCUTANEOUS | Status: AC
  Administered 2023-09-22: 284 mg via SUBCUTANEOUS

## 2023-09-22 NOTE — Patient Instructions (Signed)
 Inclisiran Injection What is this medication? INCLISIRAN (in kli SIR an) treats high cholesterol. It works by decreasing bad cholesterol (such as LDL) in your blood. Changes to diet and exercise are often combined with this medication. This medicine may be used for other purposes; ask your health care provider or pharmacist if you have questions. COMMON BRAND NAME(S): LEQVIO What should I tell my care team before I take this medication? They need to know if you have any of these conditions: An unusual or allergic reaction to inclisiran, other medications, foods, dyes, or preservatives Pregnant or trying to get pregnant Breast-feeding How should I use this medication? This medication is injected under the skin. It is given by your care team in a hospital or clinic setting. Talk to your care team about the use of this medication in children. Special care may be needed. Overdosage: If you think you have taken too much of this medicine contact a poison control center or emergency room at once. NOTE: This medicine is only for you. Do not share this medicine with others. What if I miss a dose? Keep appointments for follow-up doses. It is important not to miss your dose. Call your care team if you are unable to keep an appointment. What may interact with this medication? Interactions are not expected. This list may not describe all possible interactions. Give your health care provider a list of all the medicines, herbs, non-prescription drugs, or dietary supplements you use. Also tell them if you smoke, drink alcohol, or use illegal drugs. Some items may interact with your medicine. What should I watch for while using this medication? Visit your care team for regular checks on your progress. Tell your care team if your symptoms do not start to get better or if they get worse. You may need blood work while you are taking this medication. What side effects may I notice from receiving this  medication? Side effects that you should report to your care team as soon as possible: Allergic reactions--skin rash, itching, hives, swelling of the face, lips, tongue, or throat Side effects that usually do not require medical attention (report these to your care team if they continue or are bothersome): Joint pain Pain, redness, or irritation at injection site This list may not describe all possible side effects. Call your doctor for medical advice about side effects. You may report side effects to FDA at 1-800-FDA-1088. Where should I keep my medication? This medication is given in a hospital or clinic. It will not be stored at home. NOTE: This sheet is a summary. It may not cover all possible information. If you have questions about this medicine, talk to your doctor, pharmacist, or health care provider.  2024 Elsevier/Gold Standard (2022-06-11 00:00:00)

## 2023-09-22 NOTE — Progress Notes (Signed)
Diagnosis: Hyperlipidemia  Provider:  Chilton Greathouse MD  Procedure: Injection   Leqvio (inclisiran), Dose: 284 mg, Site: subcutaneous, Number of injections: 1  Post Care: Observation period completed  Discharge: Condition: Good, Destination: Home . AVS Provided  Performed by:  Rico Ala, LPN

## 2023-09-27 ENCOUNTER — Encounter: Payer: Self-pay | Admitting: Ophthalmology

## 2023-09-28 NOTE — Anesthesia Preprocedure Evaluation (Addendum)
Anesthesia Evaluation  Patient identified by MRN, date of birth, ID band Patient awake    Reviewed: Allergy & Precautions, H&P , NPO status , Patient's Chart, lab work & pertinent test results  History of Anesthesia Complications (+) PONV and history of anesthetic complications  Airway Mallampati: I  TM Distance: >3 FB Neck ROM: Full    Dental no notable dental hx.    Pulmonary neg pulmonary ROS   Pulmonary exam normal breath sounds clear to auscultation       Cardiovascular hypertension, + CAD  Normal cardiovascular exam Rhythm:Regular Rate:Normal     Neuro/Psych  Headaches  negative psych ROS   GI/Hepatic negative GI ROS, Neg liver ROS,,,  Endo/Other  negative endocrine ROS    Renal/GU Renal disease  negative genitourinary   Musculoskeletal negative musculoskeletal ROS (+)    Abdominal   Peds negative pediatric ROS (+)  Hematology negative hematology ROS (+)   Anesthesia Other Findings Medical History  PONV (postoperative nausea and vomiting) Kidney stones  Headache(784.0) Hypertension   "It takes a lot more to put me to sleep than for most people" Had vitrectomy 04-27-23  and received versed 2 mg IV and 50 mcg remi-fentanyl IV for that procedure.    Reproductive/Obstetrics negative OB ROS                              Anesthesia Physical Anesthesia Plan  ASA: 2  Anesthesia Plan: MAC   Post-op Pain Management:    Induction: Intravenous  PONV Risk Score and Plan:   Airway Management Planned: Natural Airway and Nasal Cannula  Additional Equipment:   Intra-op Plan:   Post-operative Plan:   Informed Consent: I have reviewed the patients History and Physical, chart, labs and discussed the procedure including the risks, benefits and alternatives for the proposed anesthesia with the patient or authorized representative who has indicated his/her understanding and  acceptance.     Dental Advisory Given  Plan Discussed with: Anesthesiologist, CRNA and Surgeon  Anesthesia Plan Comments: (Patient consented for risks of anesthesia including but not limited to:  - adverse reactions to medications - damage to eyes, teeth, lips or other oral mucosa - nerve damage due to positioning  - sore throat or hoarseness - Damage to heart, brain, nerves, lungs, other parts of body or loss of life  Patient voiced understanding and assent.)         Anesthesia Quick Evaluation

## 2023-10-04 NOTE — Discharge Instructions (Signed)

## 2023-10-05 ENCOUNTER — Ambulatory Visit: Payer: Medicare HMO | Admitting: Anesthesiology

## 2023-10-05 ENCOUNTER — Ambulatory Visit
Admission: RE | Admit: 2023-10-05 | Discharge: 2023-10-05 | Disposition: A | Discharge status: Home / Self Care | Payer: Medicare HMO | Attending: Ophthalmology | Admitting: Ophthalmology

## 2023-10-05 ENCOUNTER — Encounter
Admission: RE | Disposition: A | Discharge status: Home / Self Care | Payer: Self-pay | Source: Home / Self Care | Attending: Ophthalmology

## 2023-10-05 ENCOUNTER — Encounter: Payer: Self-pay | Admitting: Ophthalmology

## 2023-10-05 ENCOUNTER — Other Ambulatory Visit: Payer: Self-pay

## 2023-10-05 DIAGNOSIS — I2510 Atherosclerotic heart disease of native coronary artery without angina pectoris: Secondary | ICD-10-CM | POA: Insufficient documentation

## 2023-10-05 DIAGNOSIS — H2511 Age-related nuclear cataract, right eye: Secondary | ICD-10-CM | POA: Diagnosis not present

## 2023-10-05 DIAGNOSIS — I10 Essential (primary) hypertension: Secondary | ICD-10-CM | POA: Diagnosis not present

## 2023-10-05 HISTORY — PX: CATARACT EXTRACTION W/PHACO: SHX586

## 2023-10-05 HISTORY — DX: Essential (primary) hypertension: I10

## 2023-10-05 SURGERY — PHACOEMULSIFICATION, CATARACT, WITH IOL INSERTION
Anesthesia: Monitor Anesthesia Care | Laterality: Right

## 2023-10-05 MED ORDER — CYCLOPENTOLATE HCL 2 % OP SOLN: 1.0000 [drp] | OPHTHALMIC | Status: DC

## 2023-10-05 MED ORDER — FENTANYL CITRATE (PF) 100 MCG/2ML IJ SOLN
INTRAMUSCULAR | Status: AC
  Filled 2023-10-05: qty 2

## 2023-10-05 MED ORDER — ONDANSETRON HCL 4 MG/2ML IJ SOLN
INTRAMUSCULAR | Status: AC
Start: 2023-10-05 — End: ?
  Filled 2023-10-05: qty 2

## 2023-10-05 MED ORDER — SIGHTPATH DOSE#1 NA HYALUR & NA CHOND-NA HYALUR IO KIT
PACK | INTRAOCULAR | Status: DC | PRN
  Administered 2023-10-05: 1 via OPHTHALMIC

## 2023-10-05 MED ORDER — FENTANYL CITRATE (PF) 100 MCG/2ML IJ SOLN
INTRAMUSCULAR | Status: DC | PRN
  Administered 2023-10-05: 50 ug via INTRAVENOUS

## 2023-10-05 MED ORDER — BRIMONIDINE TARTRATE-TIMOLOL 0.2-0.5 % OP SOLN
OPHTHALMIC | Status: DC | PRN
  Administered 2023-10-05: 1 [drp] via OPHTHALMIC

## 2023-10-05 MED ORDER — CYCLOPENTOLATE HCL 2 % OP SOLN
1.0000 [drp] | OPHTHALMIC | Status: AC
  Administered 2023-10-05 (×3): 1 [drp] via OPHTHALMIC

## 2023-10-05 MED ORDER — PHENYLEPHRINE HCL 10 % OP SOLN: 1.0000 [drp] | OPHTHALMIC | Status: DC

## 2023-10-05 MED ORDER — CEFUROXIME OPHTHALMIC INJECTION 1 MG/0.1 ML
INJECTION | OPHTHALMIC | Status: DC | PRN
  Administered 2023-10-05: 1 mg via INTRACAMERAL

## 2023-10-05 MED ORDER — SIGHTPATH DOSE#1 BSS IO SOLN
INTRAOCULAR | Status: DC | PRN
  Administered 2023-10-05: 2 mL

## 2023-10-05 MED ORDER — SIGHTPATH DOSE#1 BSS IO SOLN
INTRAOCULAR | Status: DC | PRN
  Administered 2023-10-05: 63 mL via OPHTHALMIC

## 2023-10-05 MED ORDER — PHENYLEPHRINE HCL 10 % OP SOLN
1.0000 [drp] | OPHTHALMIC | Status: AC
  Administered 2023-10-05 (×3): 1 [drp] via OPHTHALMIC

## 2023-10-05 MED ORDER — TETRACAINE HCL 0.5 % OP SOLN
1.0000 [drp] | OPHTHALMIC | Status: AC
  Administered 2023-10-05 (×3): 1 [drp] via OPHTHALMIC

## 2023-10-05 MED ORDER — PHENYLEPHRINE HCL 10 % OP SOLN
OPHTHALMIC | Status: AC
  Filled 2023-10-05: qty 5

## 2023-10-05 MED ORDER — CYCLOPENTOLATE HCL 2 % OP SOLN
OPHTHALMIC | Status: AC
Start: 2023-10-05 — End: ?
  Filled 2023-10-05: qty 2

## 2023-10-05 MED ORDER — ONDANSETRON HCL 4 MG/2ML IJ SOLN
INTRAMUSCULAR | Status: DC | PRN
  Administered 2023-10-05: 4 mg via INTRAVENOUS

## 2023-10-05 MED ORDER — MIDAZOLAM HCL 2 MG/2ML IJ SOLN
INTRAMUSCULAR | Status: AC
  Filled 2023-10-05: qty 2

## 2023-10-05 MED ORDER — SIGHTPATH DOSE#1 BSS IO SOLN
INTRAOCULAR | Status: DC | PRN
  Administered 2023-10-05: 15 mL via INTRAOCULAR

## 2023-10-05 MED ORDER — TETRACAINE HCL 0.5 % OP SOLN
OPHTHALMIC | Status: AC
  Filled 2023-10-05: qty 4

## 2023-10-05 MED ORDER — MIDAZOLAM HCL 2 MG/2ML IJ SOLN
INTRAMUSCULAR | Status: DC | PRN
  Administered 2023-10-05: 2 mg via INTRAVENOUS

## 2023-10-05 SURGICAL SUPPLY — 10 items
CATARACT SUITE SIGHTPATH (MISCELLANEOUS) ×1
FEE CATARACT SUITE SIGHTPATH (MISCELLANEOUS) ×1 IMPLANT
GLOVE SRG 8 PF TXTR STRL LF DI (GLOVE) ×1 IMPLANT
GLOVE SURG ENC TEXT LTX SZ7.5 (GLOVE) ×1 IMPLANT
GLOVE SURG UNDER POLY LF SZ8 (GLOVE) ×1
LENS CLAREON TORIC 20.5 ×1 IMPLANT
LENS IOL CLRN TRC 3 20.5 IMPLANT
NDL FILTER BLUNT 18X1 1/2 (NEEDLE) ×1 IMPLANT
NEEDLE FILTER BLUNT 18X1 1/2 (NEEDLE) ×1
SYR 3ML LL SCALE MARK (SYRINGE) ×1 IMPLANT

## 2023-10-05 NOTE — H&P (Signed)
[facility]   Primary Care Physician:  Lupita Raider, MD Ophthalmologist: Dr. Lockie Mola  Pre-Procedure History & Physical: HPI:  Bridget Salas is a 67 y.o. female here for ophthalmic surgery.   Past Medical History:  Diagnosis Date   Headache(784.0)    Hypertension    Kidney stones    passed stone - no surgery   PONV (postoperative nausea and vomiting)    (No issues since weight loss.)   SVD (spontaneous vaginal delivery)    x 3    Past Surgical History:  Procedure Laterality Date   ABDOMINAL HYSTERECTOMY     BACK SURGERY     L4-l5   COLONOSCOPY     CYSTOCELE REPAIR N/A 05/29/2014   Procedure: ANTERIOR REPAIR (CYSTOCELE) with wide  local excision;  Surgeon: Geryl Rankins, MD;  Location: [facility];  Service: Gynecology;  Laterality: N/A;   JOINT REPLACEMENT     left knee   TUBAL LIGATION      Prior to Admission medications   Medication Sig Start Date End Date Taking? Authorizing Provider  chlorthalidone (HYGROTON) 25 MG tablet TAKE 1 TABLET (25 MG TOTAL) BY MOUTH DAILY. 09/02/23  Yes Nahser, Deloris Ping, MD  Coenzyme Q10 (Q-SORB CO Q-10) 100 MG capsule Take 200 mg by mouth daily.   Yes [provider]  CYSTEINE PO Take 1,200 mg by mouth daily. N-Acetyl-L-Cysteine   Yes [provider]  Inclisiran Sodium (LEQVIO [place]) Inject into the skin.   Yes [provider]  Melatonin 10 MG/ML LIQD Take 2 mLs by mouth daily.   Yes [provider]  Menaquinone-7 (VITAMIN K2 PO) Take 1 tablet by mouth daily.   Yes [provider]  NON FORMULARY Isagenix products   Yes [provider]  OVER THE COUNTER MEDICATION daily. Z Pack: zinc 30 mg, Quercitin 500 mg, vitamin c 800 mg, vitamin d 125 mg   Yes [provider]  potassium chloride (KLOR-CON) 10 MEQ tablet Take 1 tablet (10 mEq total) by mouth daily. 04/21/23  Yes Nahser, Deloris Ping, MD  Red Yeast Rice 600 MG TABS Take 1,200 mg by mouth at bedtime.   Yes [provider]  valsartan (DIOVAN) 80 MG tablet TAKE 1 TABLET BY MOUTH EVERY DAY 01/18/23  Yes Nahser, Deloris Ping, MD  Zinc 50 MG TABS 1 tablet Orally Once a day   Yes [provider]  SUMAtriptan (IMITREX) 100 MG tablet Take 100 mg by mouth every 2 (two) hours as needed for migraine or headache. May repeat in 2 hours if headache persists or recurs. Patient not taking: Reported on 09/27/2023    [provider]    Allergies as of 09/16/2023 - Review Complete 08/26/2023  Allergen Reaction Noted   Aleve [naproxen sodium]  05/31/2019   Mobic [meloxicam] Hives 02/27/2018   Rosuvastatin  01/04/2022    Family History  Problem Relation Age of Onset   Hypertension Father     Social History   Socioeconomic History   Marital status: Widowed    Spouse name: Not on file   Number of children: Not on file   Years of education: Not on file   Highest education level: Not on file  Occupational History   Not on file  Tobacco Use   Smoking status: Never   Smokeless tobacco: Never  Vaping Use   Vaping status: Never Used  Substance and Sexual Activity   Alcohol use: No   Drug use: No   Sexual activity:  Yes    Birth control/protection: Surgical  Other Topics Concern   Not on file  Social History Narrative   Not on file   Social Determinants of Health   Financial Resource Strain: Not on file  Food Insecurity: Not on file  Transportation Needs: Not on file  Physical Activity: Not on file  Stress: Not on file  Social Connections: Not on file  Intimate Partner Violence: Not on file    Review of Systems: See HPI, otherwise negative ROS  Physical Exam: BP 124/65   Pulse 63   Temp (!) 97.3 F (36.3 C)   Resp 11   Ht 5' 2.01" (1.575 m)   Wt 77.2 kg   SpO2 97%   BMI 31.12 kg/m  General:   Alert,  pleasant and cooperative in NAD Head:  Normocephalic and atraumatic. Lungs:  Clear to auscultation.    Heart:  Regular rate and rhythm.   Impression/Plan: Bridget Salas is here for ophthalmic surgery.  Risks, benefits, limitations, and alternatives regarding ophthalmic surgery have been reviewed with the patient.  Questions have been answered.  All parties agreeable.   Lockie Mola, MD  10/05/2023, 9:47 AM

## 2023-10-05 NOTE — Anesthesia Postprocedure Evaluation (Signed)
Anesthesia Post Note  Patient: Bridget Salas  Procedure(s) Performed: CATARACT EXTRACTION PHACO AND INTRAOCULAR LENS PLACEMENT (IOC) RIGHT  CLAREON TORIC LENS 5.90 00:30.8 (Right)  Anesthesia Type: MAC Anesthetic complications: no   No notable events documented.   Last Vitals:  Vitals:   10/05/23 1025 10/05/23 1029  BP: (!) 113/54 (!) 105/58  Pulse: (!) 53   Resp: 18   Temp: 36.7 C 36.7 C  SpO2: 98%     Last Pain:  Vitals:   10/05/23 1029  PainSc: 0-No pain                 Dereon Williamsen C Tallyn Holroyd

## 2023-10-05 NOTE — Op Note (Signed)
LOCATION:  [facility]   PREOPERATIVE DIAGNOSIS:  Nuclear sclerotic cataract of the right eye.  H25.11   POSTOPERATIVE DIAGNOSIS:  Nuclear sclerotic cataract of the right eye.   PROCEDURE:  Phacoemulsification with Toric posterior chamber intraocular lens placement of the right eye.  Ultrasound time: Procedure(s): CATARACT EXTRACTION PHACO AND INTRAOCULAR LENS PLACEMENT (IOC) RIGHT  CLAREON TORIC LENS 5.90 00:30.8 (Right)  LENS:   Implant Name Type Inv. Item Serial No. Manufacturer Lot No. LRB No. Used Action  alcon clareon toric   [id number]   Right 1 Implanted     [id number] 20.5D Toric intraocular lens with 1.5 diopters of cylindrical power with axis orientation at 166 degrees.   SURGEON:  Deirdre Evener, MD   ANESTHESIA: Topical with tetracaine drops and 2% Xylocaine jelly, augmented with 1% preservative-free intracameral lidocaine. .   COMPLICATIONS:  None.   DESCRIPTION OF PROCEDURE:  The patient was identified in the holding room and transported to the operating suite and placed in the supine position under the operating microscope.  The right eye was identified as the operative eye, and it was prepped and draped in the usual sterile ophthalmic fashion.    A clear-corneal paracentesis incision was made at the 12:00 position.  0.5 ml of preservative-free 1% lidocaine was injected into the anterior chamber. The anterior chamber was filled with Viscoat.  A 2.4 millimeter near clear corneal incision was then made at the 9:00 position.  A cystotome and capsulorrhexis forceps were then used to make a curvilinear capsulorrhexis.  Hydrodissection and hydrodelineation were then performed using balanced salt solution.   Phacoemulsification was then used in stop and chop fashion to remove the lens, nucleus and epinucleus.  The remaining cortex was aspirated using the irrigation and aspiration handpiece.  Provisc viscoelastic was then placed into the capsular bag to distend it  for lens placement.  The Verion digital marker was used to align the implant at the intended axis.   A Toric lens was then injected into the capsular bag.  It was rotated clockwise until the axis marks on the lens were approximately 15 degrees in the counterclockwise direction to the intended alignment.  The viscoelastic was aspirated from the eye using the irrigation aspiration handpiece.  Then, a Koch spatula through the sideport incision was used to rotate the lens in a clockwise direction until the axis markings of the intraocular lens were lined up with the Verion alignment.  Balanced salt solution was then used to hydrate the wounds. Cefuroxime 0.1 ml of a 10mg /ml solution was injected into the anterior chamber for a dose of 1 mg of intracameral antibiotic at the completion of the case.    The eye was noted to have a physiologic pressure and there was no wound leak noted.   Timolol and Brimonidine drops were applied to the eye.  The patient was taken to the recovery room in stable condition having had no complications of anesthesia or surgery.  Undra Trembath 10/05/2023, 10:24 AM

## 2023-10-05 NOTE — Transfer of Care (Signed)
Immediate Anesthesia Transfer of Care Note  Patient: Bridget Salas  Procedure(s) Performed: CATARACT EXTRACTION PHACO AND INTRAOCULAR LENS PLACEMENT (IOC) RIGHT  CLAREON TORIC LENS 5.90 00:30.8 (Right)  Patient Location: PACU  Anesthesia Type: MAC  Level of Consciousness: awake, alert  and patient cooperative  Airway and Oxygen Therapy: Patient Spontanous Breathing and Patient connected to supplemental oxygen  Post-op Assessment: Post-op Vital signs reviewed, Patient's Cardiovascular Status Stable, Respiratory Function Stable, Patent Airway and No signs of Nausea or vomiting  Post-op Vital Signs: Reviewed and stable  Complications: No notable events documented.

## 2023-10-12 ENCOUNTER — Encounter: Payer: Self-pay | Admitting: Ophthalmology

## 2023-10-14 DIAGNOSIS — H35341 Macular cyst, hole, or pseudohole, right eye: Secondary | ICD-10-CM | POA: Diagnosis not present

## 2023-11-16 DIAGNOSIS — M1711 Unilateral primary osteoarthritis, right knee: Secondary | ICD-10-CM | POA: Diagnosis not present

## 2023-11-21 ENCOUNTER — Encounter: Payer: Self-pay | Admitting: Ophthalmology

## 2023-11-24 ENCOUNTER — Encounter: Payer: Self-pay | Admitting: Internal Medicine

## 2023-11-24 NOTE — Anesthesia Preprocedure Evaluation (Addendum)
Anesthesia Evaluation    Airway        Dental   Pulmonary           Cardiovascular hypertension,      Neuro/Psych    GI/Hepatic   Endo/Other    Renal/GU      Musculoskeletal   Abdominal   Peds  Hematology   Anesthesia Other Findings "It takes a lot more to put me to sleep than for most people" Had vitrectomy 04-27-23  and received versed 2 mg IV and 50 mcg remi-fentanyl IV for that procedure.   Previous cataract surgery 10-05-23 Dr. Juel Burrow anesthesiologist and had fentanyl 50 mcg IV, versed 2 mg IV and zofran 4 mg IV  Reproductive/Obstetrics                             Anesthesia Physical Anesthesia Plan Anesthesia Quick Evaluation

## 2023-12-01 ENCOUNTER — Other Ambulatory Visit: Payer: Self-pay | Admitting: Cardiovascular Disease

## 2023-12-01 LAB — LAB REPORT - SCANNED: EGFR: 50

## 2023-12-05 NOTE — Discharge Instructions (Signed)

## 2023-12-07 ENCOUNTER — Ambulatory Visit
Admission: RE | Admit: 2023-12-07 | Discharge: 2023-12-07 | Disposition: A | Discharge status: Home / Self Care | Payer: Medicare HMO | Source: Ambulatory Visit | Attending: Ophthalmology | Admitting: Ophthalmology

## 2023-12-07 ENCOUNTER — Other Ambulatory Visit: Payer: Self-pay

## 2023-12-07 ENCOUNTER — Ambulatory Visit: Payer: Medicare HMO | Admitting: Anesthesiology

## 2023-12-07 ENCOUNTER — Encounter: Payer: Self-pay | Admitting: Ophthalmology

## 2023-12-07 ENCOUNTER — Encounter
Admission: RE | Disposition: A | Discharge status: Home / Self Care | Payer: Self-pay | Source: Ambulatory Visit | Attending: Ophthalmology

## 2023-12-07 DIAGNOSIS — H2512 Age-related nuclear cataract, left eye: Secondary | ICD-10-CM | POA: Insufficient documentation

## 2023-12-07 DIAGNOSIS — R519 Headache, unspecified: Secondary | ICD-10-CM | POA: Diagnosis not present

## 2023-12-07 DIAGNOSIS — I10 Essential (primary) hypertension: Secondary | ICD-10-CM | POA: Insufficient documentation

## 2023-12-07 DIAGNOSIS — I2510 Atherosclerotic heart disease of native coronary artery without angina pectoris: Secondary | ICD-10-CM | POA: Diagnosis not present

## 2023-12-07 DIAGNOSIS — H269 Unspecified cataract: Secondary | ICD-10-CM | POA: Diagnosis not present

## 2023-12-07 HISTORY — PX: CATARACT EXTRACTION W/PHACO: SHX586

## 2023-12-07 SURGERY — PHACOEMULSIFICATION, CATARACT, WITH IOL INSERTION
Anesthesia: Monitor Anesthesia Care | Site: Eye | Laterality: Left

## 2023-12-07 MED ORDER — MIDAZOLAM HCL 2 MG/2ML IJ SOLN
INTRAMUSCULAR | Status: AC
  Filled 2023-12-07: qty 2

## 2023-12-07 MED ORDER — ONDANSETRON HCL 4 MG/2ML IJ SOLN
INTRAMUSCULAR | Status: AC
  Filled 2023-12-07: qty 2

## 2023-12-07 MED ORDER — TETRACAINE HCL 0.5 % OP SOLN
1.0000 [drp] | OPHTHALMIC | Status: AC | PRN
Start: 2023-12-07 — End: 2023-12-07
  Administered 2023-12-07 (×3): 1 [drp] via OPHTHALMIC

## 2023-12-07 MED ORDER — BRIMONIDINE TARTRATE-TIMOLOL 0.2-0.5 % OP SOLN
OPHTHALMIC | Status: DC | PRN
  Administered 2023-12-07: 1 [drp] via OPHTHALMIC

## 2023-12-07 MED ORDER — MIDAZOLAM HCL 2 MG/2ML IJ SOLN
INTRAMUSCULAR | Status: DC | PRN
  Administered 2023-12-07: 2 mg via INTRAVENOUS

## 2023-12-07 MED ORDER — CYCLOPENTOLATE HCL 2 % OP SOLN
1.0000 [drp] | OPHTHALMIC | Status: AC | PRN
  Administered 2023-12-07 (×3): 1 [drp] via OPHTHALMIC

## 2023-12-07 MED ORDER — FENTANYL CITRATE (PF) 100 MCG/2ML IJ SOLN
INTRAMUSCULAR | Status: DC | PRN
  Administered 2023-12-07: 50 ug via INTRAVENOUS

## 2023-12-07 MED ORDER — CYCLOPENTOLATE HCL 2 % OP SOLN
OPHTHALMIC | Status: AC
  Filled 2023-12-07: qty 2

## 2023-12-07 MED ORDER — CEFUROXIME OPHTHALMIC INJECTION 1 MG/0.1 ML
INJECTION | OPHTHALMIC | Status: DC | PRN
  Administered 2023-12-07: .1 mL via INTRACAMERAL

## 2023-12-07 MED ORDER — SIGHTPATH DOSE#1 BSS IO SOLN
INTRAOCULAR | Status: DC | PRN
  Administered 2023-12-07: 15 mL via INTRAOCULAR

## 2023-12-07 MED ORDER — SODIUM CHLORIDE 0.9% FLUSH: 10.0000 mL | INTRAVENOUS | Status: DC | PRN

## 2023-12-07 MED ORDER — TETRACAINE HCL 0.5 % OP SOLN
OPHTHALMIC | Status: AC
  Filled 2023-12-07: qty 4

## 2023-12-07 MED ORDER — PHENYLEPHRINE HCL 10 % OP SOLN
1.0000 [drp] | OPHTHALMIC | Status: AC | PRN
Start: 2023-12-07 — End: 2023-12-07
  Administered 2023-12-07 (×3): 1 [drp] via OPHTHALMIC

## 2023-12-07 MED ORDER — FENTANYL CITRATE (PF) 100 MCG/2ML IJ SOLN
INTRAMUSCULAR | Status: AC
  Filled 2023-12-07: qty 2

## 2023-12-07 MED ORDER — SIGHTPATH DOSE#1 BSS IO SOLN
INTRAOCULAR | Status: DC | PRN
  Administered 2023-12-07: 50 mL via OPHTHALMIC

## 2023-12-07 MED ORDER — ONDANSETRON HCL 4 MG/2ML IJ SOLN
4.0000 mg | Freq: Once | INTRAMUSCULAR | Status: AC
  Administered 2023-12-07: 4 mg via INTRAVENOUS

## 2023-12-07 MED ORDER — PHENYLEPHRINE HCL 10 % OP SOLN
OPHTHALMIC | Status: AC
  Filled 2023-12-07: qty 5

## 2023-12-07 MED ORDER — ARMC OPHTHALMIC DILATING DROPS
OPHTHALMIC | Status: AC
  Filled 2023-12-07: qty 0.5

## 2023-12-07 MED ORDER — SIGHTPATH DOSE#1 BSS IO SOLN
INTRAOCULAR | Status: DC | PRN
  Administered 2023-12-07: 1 mL

## 2023-12-07 MED ORDER — SIGHTPATH DOSE#1 NA HYALUR & NA CHOND-NA HYALUR IO KIT
PACK | INTRAOCULAR | Status: DC | PRN
  Administered 2023-12-07: 1 via OPHTHALMIC

## 2023-12-07 SURGICAL SUPPLY — 18 items
CANNULA ANT/CHMB 27G (MISCELLANEOUS) IMPLANT
CANNULA ANT/CHMB 27GA (MISCELLANEOUS)
CATARACT SUITE SIGHTPATH (MISCELLANEOUS) ×1
FEE CATARACT SUITE SIGHTPATH (MISCELLANEOUS) ×1 IMPLANT
GLOVE SRG 8 PF TXTR STRL LF DI (GLOVE) ×1 IMPLANT
GLOVE SURG ENC TEXT LTX SZ7.5 (GLOVE) ×1 IMPLANT
GLOVE SURG GAMMEX PI TX LF 7.5 (GLOVE) IMPLANT
LENS CLAREON TORIC CNW0T3 ×1 IMPLANT
LENS IOL CLRN TRC 3 26.0 IMPLANT
NDL FILTER BLUNT 18X1 1/2 (NEEDLE) ×1 IMPLANT
NDL RETROBULBAR .5 NSTRL (NEEDLE) IMPLANT
NEEDLE FILTER BLUNT 18X1 1/2 (NEEDLE) ×1
PACK VIT ANT 23G (MISCELLANEOUS) IMPLANT
RING MALYGIN 7.0 (MISCELLANEOUS) IMPLANT
SUT ETHILON 10-0 CS-B-6CS-B-6 (SUTURE)
SUT VICRYL 9 0 (SUTURE) IMPLANT
SUTURE EHLN 10-0 CS-B-6CS-B-6 (SUTURE) IMPLANT
SYR 3ML LL SCALE MARK (SYRINGE) ×1 IMPLANT

## 2023-12-07 NOTE — Transfer of Care (Signed)
 Immediate Anesthesia Transfer of Care Note  Patient: Bridget Salas  Procedure(s) Performed: CATARACT EXTRACTION PHACO AND INTRAOCULAR LENS PLACEMENT (IOC) LEFT CLAREON TORIC (Left)  Patient Location: PACU  Anesthesia Type: MAC  Level of Consciousness: awake, alert  and patient cooperative  Airway and Oxygen Therapy: Patient Spontanous Breathing and Patient connected to supplemental oxygen  Post-op Assessment: Post-op Vital signs reviewed, Patient's Cardiovascular Status Stable, Respiratory Function Stable, Patent Airway and No signs of Nausea or vomiting  Post-op Vital Signs: Reviewed and stable  Complications: No notable events documented.

## 2023-12-07 NOTE — Op Note (Signed)
 LOCATION:  [facility]   PREOPERATIVE DIAGNOSIS:  Nuclear sclerotic cataract of the left eye.  H25.12  POSTOPERATIVE DIAGNOSIS:  Nuclear sclerotic cataract of the left eye.   PROCEDURE:  Phacoemulsification with Toric posterior chamber intraocular lens placement of the left eye.  Ultrasound time: Procedure(s): CATARACT EXTRACTION PHACO AND INTRAOCULAR LENS PLACEMENT (IOC) LEFT CLAREON TORIC 4.54, 00:46.4 (Left)  LENS:   Implant Name Type Inv. Item Serial No. Manufacturer Lot No. LRB No. Used Action  LENS [place] [id number] - [id number]  LENS GALVIN GREGO [id number] [facility]  Left 1 Implanted     Toric intraocular lens with 1.5 diopters of cylindrical power with axis orientation at 5 degrees.     SURGEON:  Dene FABIENE Etienne, MD   ANESTHESIA:  Topical with tetracaine  drops and 2% Xylocaine  jelly, augmented with 1% preservative-free intracameral lidocaine .  COMPLICATIONS:  None.   DESCRIPTION OF PROCEDURE:  The patient was identified in the holding room and transported to the operating suite and placed in the supine position under the operating microscope.  The left eye was identified as the operative eye, and it was prepped and draped in the usual sterile ophthalmic fashion.    A clear-corneal paracentesis incision was made at the 1:30 position.  0.5 ml of preservative-free 1% lidocaine  was injected into the anterior chamber. The anterior chamber was filled with Viscoat.  A 2.4 millimeter near clear corneal incision was then made at the 10:30 position.  A cystotome and capsulorrhexis forceps were then used to make a curvilinear capsulorrhexis.  Hydrodissection and hydrodelineation were then performed using balanced salt solution.   Phacoemulsification was then used in stop and chop fashion to remove the lens, nucleus and epinucleus.  The remaining cortex was aspirated using the irrigation and aspiration handpiece.  Provisc viscoelastic was then placed into the  capsular bag to distend it for lens placement.  The Verion digital marker was used to align the implant at the intended axis.   A Toric lens was then injected into the capsular bag.  It was rotated clockwise until the axis marks on the lens were approximately 15 degrees in the counterclockwise direction to the intended alignment.  The viscoelastic was aspirated from the eye using the irrigation aspiration handpiece.  Then, a Koch spatula through the sideport incision was used to rotate the lens in a clockwise direction until the axis markings of the intraocular lens were lined up with the Verion alignment.  Balanced salt solution was then used to hydrate the wounds. Cefuroxime  0.1 ml of a 10mg /ml solution was injected into the anterior chamber for a dose of 1 mg of intracameral antibiotic at the completion of the case.    The eye was noted to have a physiologic pressure and there was no wound leak noted.   Timolol  and Brimonidine  drops were applied to the eye.  The patient was taken to the recovery room in stable condition having had no complications of anesthesia or surgery.  Tremont Gavitt 12/07/2023, 10:24 AM

## 2023-12-07 NOTE — H&P (Signed)
 [facility]   Primary Care Physician:  Loreli Kins, MD Ophthalmologist: Dr. Dene Etienne  Pre-Procedure History & Physical: HPI:  Bridget Salas is a 68 y.o. female here for ophthalmic surgery.   Past Medical History:  Diagnosis Date   Headache(784.0)    Hypertension    Kidney stones    passed stone - no surgery   PONV (postoperative nausea and vomiting)    (No issues since weight loss.)   SVD (spontaneous vaginal delivery)    x 3    Past Surgical History:  Procedure Laterality Date   ABDOMINAL HYSTERECTOMY     BACK SURGERY     L4-l5   CATARACT EXTRACTION W/PHACO Right 10/05/2023   Procedure: CATARACT EXTRACTION PHACO AND INTRAOCULAR LENS PLACEMENT (IOC) RIGHT  CLAREON TORIC LENS 5.90 00:30.8;  Surgeon: Etienne Dene, MD;  Location: [facility];  Service: Ophthalmology;  Laterality: Right;   COLONOSCOPY     CYSTOCELE REPAIR N/A 05/29/2014   Procedure: ANTERIOR REPAIR (CYSTOCELE) with wide  local excision;  Surgeon: Hargis Paradise, MD;  Location: [facility];  Service: Gynecology;  Laterality: N/A;   JOINT REPLACEMENT     left knee   TUBAL LIGATION      Prior to Admission medications   Medication Sig Start Date End Date Taking? Authorizing Provider  chlorthalidone  (HYGROTON ) 25 MG tablet TAKE 1 TABLET (25 MG TOTAL) BY MOUTH DAILY. 12/01/23  Yes Nahser, Aleene PARAS, MD  Coenzyme Q10 (Q-SORB CO Q-10) 100 MG capsule Take 200 mg by mouth daily.   Yes [provider]  CYSTEINE PO Take 1,200 mg by mouth daily. N-Acetyl-L-Cysteine   Yes [provider]  Inclisiran Sodium  (LEQVIO  [place]) Inject into the skin.   Yes [provider]  Melatonin 10 MG/ML LIQD Take 2 mLs by mouth daily.   Yes [provider]  Menaquinone-7 (VITAMIN K2 PO) Take 1 tablet by mouth daily.   Yes [provider]  NON FORMULARY Isagenix products   Yes [provider]  OVER THE COUNTER MEDICATION daily. Z Pack: zinc 30 mg, Quercitin 500  mg, vitamin c 800 mg, vitamin d 125 mg   Yes [provider]  potassium chloride  (KLOR-CON ) 10 MEQ tablet Take 1 tablet (10 mEq total) by mouth daily. 04/21/23  Yes Nahser, Aleene PARAS, MD  Red Yeast Rice 600 MG TABS Take 1,200 mg by mouth at bedtime.   Yes [provider]  valsartan  (DIOVAN ) 80 MG tablet TAKE 1 TABLET BY MOUTH EVERY DAY 01/18/23  Yes Nahser, Aleene PARAS, MD  Zinc 50 MG TABS 1 tablet Orally Once a day   Yes [provider]  SUMAtriptan  (IMITREX ) 100 MG tablet Take 100 mg by mouth every 2 (two) hours as needed for migraine or headache. May repeat in 2 hours if headache persists or recurs. Patient not taking: Reported on 09/27/2023    [provider]    Allergies as of 11/09/2023 - Review Complete 10/05/2023  Allergen Reaction Noted   Aleve [naproxen sodium]  05/31/2019   Mobic [meloxicam] Hives 02/27/2018   Rosuvastatin   01/04/2022    Family History  Problem Relation Age of Onset   Hypertension Father     Social History   Socioeconomic History   Marital status: Widowed    Spouse name: Not on file   Number of children: Not on file   Years of education: Not on file   Highest education level: Not on file  Occupational History   Not on file  Tobacco Use   Smoking status: Never   Smokeless tobacco: Never  Vaping Use   Vaping status: Never Used  Substance and Sexual Activity   Alcohol use: No   Drug use: No   Sexual activity: Yes    Birth control/protection: Surgical  Other Topics Concern   Not on file  Social History Narrative   Not on file   Social Drivers of Health   Financial Resource Strain: Not on file  Food Insecurity: Not on file  Transportation Needs: Not on file  Physical Activity: Not on file  Stress: Not on file  Social Connections: Not on file  Intimate Partner Violence: Not on file    Review of Systems: See HPI, otherwise negative ROS  Physical Exam: Ht 5' 2 (1.575 m)   Wt 76.2 kg   BMI 30.73 kg/m   General:   Alert,  pleasant and cooperative in NAD Head:  Normocephalic and atraumatic. Lungs:  Clear to auscultation.    Heart:  Regular rate and rhythm.   Impression/Plan: Bridget Salas is here for ophthalmic surgery.  Risks, benefits, limitations, and alternatives regarding ophthalmic surgery have been reviewed with the patient.  Questions have been answered.  All parties agreeable.   MITTIE GASKIN, MD  12/07/2023, 9:14 AM

## 2023-12-07 NOTE — Anesthesia Postprocedure Evaluation (Signed)
 Anesthesia Post Note  Patient: BREXLEY CUTSHAW  Procedure(s) Performed: CATARACT EXTRACTION PHACO AND INTRAOCULAR LENS PLACEMENT (IOC) LEFT CLAREON TORIC 4.54, 00:46.4 (Left: Eye)  Patient location during evaluation: PACU Anesthesia Type: MAC Level of consciousness: awake and alert Pain management: pain level controlled Vital Signs Assessment: post-procedure vital signs reviewed and stable Respiratory status: spontaneous breathing, nonlabored ventilation, respiratory function stable and patient connected to nasal cannula oxygen Cardiovascular status: stable and blood pressure returned to baseline Postop Assessment: no apparent nausea or vomiting Anesthetic complications: no   No notable events documented.   Last Vitals:  Vitals:   12/07/23 1026 12/07/23 1030  BP: 113/60 (!) 106/51  Pulse: (!) 54 (!) 57  Resp: (!) 6 10  Temp: (!) 36.1 C (!) 36.1 C  SpO2: 99% 99%    Last Pain:  Vitals:   12/07/23 1030  TempSrc:   PainSc: 0-No pain                 Slyvester Latona C Tamanika Heiney

## 2023-12-10 ENCOUNTER — Encounter: Payer: Self-pay | Admitting: Ophthalmology

## 2023-12-22 ENCOUNTER — Encounter: Payer: Self-pay | Admitting: Cardiovascular Disease

## 2023-12-22 NOTE — Progress Notes (Signed)
Cardiology Office Note:    Date:  12/23/2023   ID:  Bridget Salas, DOB Dec 20, 1955, MRN [id number]  PCP:  Lupita Raider, MD   [facility] Providers Cardiologist:  Terriana Barreras    Referring MD: Lupita Raider, MD   Chief Complaint  Patient presents with   Hypertension        Hyperlipidemia    May 29, 2021   Bridget Salas is a 68 y.o. female with a hx of elevate BP recently I was asked by Bridget Dyer, MS ( psychology) to see her for markedly elevated BP   BP has typically been well controlled. Her future daughter in law recently had a stroke ( April )  DIL has had severe HTN - had stopped her meds   Has been under lots of stress. Walks 3-5 miles a day . Avoids salt,   avoids salty foods Does eat healthy choice meals  - has backed off on the pre-prepared meals. She was just started on Lisinopril 10 mg a day  Has developed a dry hacky cough.    Wt today is 176   - was 256 lbs in 2015  No weight gain recently  Non smoker No ETOH, no drugs   No licorice  Is sleeping well   Has been under lots of stress with her   parents   Aug. 5, 2022;  I saw Bridget Salas for HTN recently  Echo showed normal LV systolic function   grade 1 DD Also showed mild aortic dilatation .  Aortic CTA showed ectatic asc. Aorta measuring 3.9  CM    BP is still a bit elevated  Wt is 180 No CP ,  no dyspnea Exercises regularly  Has been trying to avid salt  Avoids cholesterol   Nov. 7, 2022 Bridget Salas is seen today for HTN, mild aortic diltation Grade 1 DD BP looks great.  Feels mentally like crap, Physically feels great  Getting some exercise   LDL in Oct. 4, 2022 is 195.  Is not taking her rosuvastatin - muscle aches.   CT angio of her chest showed mild dilatation of her transverse aorta 3.9 cm.   Jan. 29, 2024  Bridget Salas is seen today for follow up of her HTN, mild aortic dilatation LDL remains elevated.   Her last LDL ws 179 CAC score in July, 2022 was 73.2 ( 76th percentile for  age/ sex matched controls )  BP is ok at home  Is exercising regularly ,   No CP , no dyspnea   Went skydiving recently   Lipids are very elevated.  LDL was 179. CAC score is 73.   Jan. 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  Caused lots of fatigue, body aches.   She has been seen by Dr. Rennis Golden in the lipid clinic.  I am planning on having her follow-up with Dr. Rennis Golden upon my retirement at the end of June.  She had recent labs.  Her LDL is still quite high.  She is intolerant to statins in the is intolerant to inclisiran.  I think she may need to be referred for consideration of a  PCSK9 inhibitor.  I will defer to Dr. Rennis Golden on that decision.     Past Medical History:  Diagnosis Date   Headache(784.0)    Hypertension    Kidney stones    passed stone - no surgery   PONV (postoperative nausea and vomiting)    (No issues since weight loss.)   SVD (spontaneous vaginal delivery)    x 3    Past Surgical History:  Procedure Laterality Date   ABDOMINAL HYSTERECTOMY     BACK SURGERY     L4-l5   CATARACT EXTRACTION W/PHACO Right 10/05/2023   Procedure: CATARACT EXTRACTION PHACO AND INTRAOCULAR LENS PLACEMENT (IOC) RIGHT  CLAREON TORIC LENS 5.90 00:30.8;  Surgeon: Bridget Mola, MD;  Location: [facility];  Service: Ophthalmology;  Laterality: Right;   CATARACT EXTRACTION W/PHACO Left 12/07/2023   Procedure: CATARACT EXTRACTION PHACO AND INTRAOCULAR LENS PLACEMENT (IOC) LEFT CLAREON TORIC 4.54, 00:46.4;  Surgeon: Bridget Mola, MD;  Location: [facility];  Service: Ophthalmology;  Laterality: Left;   COLONOSCOPY     CYSTOCELE REPAIR N/A 05/29/2014   Procedure:  ANTERIOR REPAIR (CYSTOCELE) with wide  local excision;  Surgeon: Bridget Rankins, MD;  Location: [facility];  Service: Gynecology;  Laterality: N/A;   JOINT REPLACEMENT     left knee   TUBAL LIGATION      Current Medications: Current Meds  Medication Sig   chlorthalidone (HYGROTON) 25 MG tablet TAKE 1 TABLET (25 MG TOTAL) BY MOUTH DAILY.   Coenzyme Q10 (Q-SORB CO Q-10) 100 MG capsule Take 200 mg by mouth daily.   CYSTEINE PO Take 1,200 mg by mouth daily. N-Acetyl-L-Cysteine   Inclisiran Sodium (LEQVIO [place]) Inject into the skin.   Melatonin 10 MG/ML LIQD Take 2 mLs by mouth daily.   Menaquinone-7 (VITAMIN K2 PO) Take 1 tablet by mouth daily.   milk thistle 175 MG tablet Take 175 mg by mouth daily.   NON FORMULARY Isagenix products   OVER THE COUNTER MEDICATION daily. Z Pack: zinc 30 mg, Quercitin 500 mg, vitamin c 800 mg, vitamin d 125 mg   potassium chloride (KLOR-CON) 10 MEQ tablet Take 1 tablet (10 mEq total) by mouth daily.   Red Yeast Rice 600 MG TABS Take 1,200 mg by mouth at bedtime.   SUMAtriptan (IMITREX) 100 MG tablet Take 100 mg by mouth every 2 (two) hours as needed for migraine or headache. May repeat in 2 hours if headache persists or recurs.   valsartan (DIOVAN) 80 MG tablet TAKE 1 TABLET BY MOUTH EVERY DAY   Zinc 50 MG TABS 1 tablet Orally Once a day     Allergies:   Aleve [naproxen sodium], Mobic [meloxicam], and Rosuvastatin   Social History   Socioeconomic History   Marital status: Widowed    Spouse name: Not on file   Number of children: Not on file   Years of education: Not on file   Highest education level: Not on file  Occupational History   Not on file  Tobacco Use   Smoking status: Never   Smokeless tobacco: Never  Vaping Use   Vaping status: Never Used  Substance and Sexual Activity   Alcohol use: No   Drug use: No   Sexual activity: Yes    Birth control/protection: Surgical  Other Topics Concern   Not on file  Social History Narrative   Not on  file   Social Drivers of Health   Financial Resource Strain: Not on file  Food Insecurity: Not on file  Transportation Needs: Not on file  Physical Activity: Not on file  Stress: Not on file  Social Connections: Not on file     Family History: The patient's family history includes Hypertension in her father.  ROS:   Please see the history of present illness.     All other systems reviewed and are negative.  EKGs/Labs/Other Studies Reviewed:    The following studies were reviewed today:   EKG:     EKG Interpretation Date/Time:  Friday December 23 2023 08:20:39 EST Ventricular Rate:  56 PR Interval:  142 QRS Duration:  76 QT Interval:  412 QTC Calculation: 397 R Axis:   20  Text Interpretation: Sinus bradycardia with sinus arrhythmia When compared with ECG of 10-Dec-2010 10:34, T wave inversion no longer evident in Anterior leads Confirmed by Kristeen Miss ([id number]) on 12/23/2023 8:47:31 AM     Recent Labs: 03/18/2023: BUN 21; Creatinine, Ser 1.36; Potassium 3.7; Sodium 141  Recent Lipid Panel    Component Value Date/Time   CHOL 268 (H) 03/18/2023 0933   TRIG 114 03/18/2023 0933   HDL 56 03/18/2023 0933   CHOLHDL 4.8 (H) 03/18/2023 0933   LDLCALC 192 (H) 03/18/2023 0933     Risk Assessment/Calculations:           Physical Exam:    Physical Exam: Blood pressure 136/82, pulse (!) 57, height 5\' 2"  (1.575 m), weight 167 lb 12.8 oz (76.1 kg), SpO2 98%.       GEN:  Well nourished, well developed in no acute distress HEENT: Normal NECK: No JVD; No carotid bruits LYMPHATICS: No lymphadenopathy CARDIAC: RRR , no murmurs, rubs, gallops RESPIRATORY:  Clear to auscultation without rales, wheezing or rhonchi  ABDOMEN: Soft, non-tender, non-distended MUSCULOSKELETAL:  No edema; No deformity  SKIN: Warm and dry NEUROLOGIC:  Alert and oriented x 3    ASSESSMENT:    1. Elevated coronary artery calcium score      PLAN:       HTN:   Blood pressure is  well-controlled.  Continue current medications.   2.  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  She will need annual follow-up for this.     3.  Hyperlipidemia: Her LDL remains elevated.  She is intolerant to statins.  She also had a bad reaction to inclisiran.  She will be seeing Dr. Rennis Golden in several months.  I will defer to his management.  I suspect she should try one of the PCSK9's next.  Will have her follow-up with Dr. Rennis Golden.            Medication Adjustments/Labs and Tests Ordered: Current medicines are reviewed at length with the patient today.  Concerns regarding medicines are outlined above.  Orders Placed This Encounter  Procedures   EKG 12-Lead     No orders of the defined types were placed in this encounter.     Patient Instructions  Follow-Up: At [facility], you and your health needs are our priority.  As part of our continuing mission to provide you with exceptional heart care, we have created designated Provider Care Teams.  These Care Teams include your primary Cardiologist (physician) and Advanced Practice Providers (APPs -  Physician Assistants and Nurse Practitioners) who all work together to provide you with the care you need, when you need it.  We recommend signing up for the patient portal called "MyChart".  Sign up information is provided on this After Visit Summary.  MyChart  is used to connect with patients for Virtual Visits (Telemedicine).  Patients are able to view lab/test results, encounter notes, upcoming appointments, etc.  Non-urgent messages can be sent to your provider as well.   To learn more about what you can do with MyChart, go to [email or web address].    Your next appointment:   Upcoming appt  Provider:   Dr. Rennis Golden  Other Instructions         Signed, Kristeen Miss, MD  12/23/2023 10:04 AM    [facility]

## 2023-12-23 ENCOUNTER — Other Ambulatory Visit: Payer: Self-pay

## 2023-12-23 ENCOUNTER — Ambulatory Visit: Payer: Medicare HMO | Attending: Cardiovascular Disease | Admitting: Cardiovascular Disease

## 2023-12-23 VITALS — BP 136/82 | HR 57 | Ht 62.0 in | Wt 167.8 lb

## 2023-12-23 DIAGNOSIS — R931 Abnormal findings on diagnostic imaging of heart and coronary circulation: Secondary | ICD-10-CM | POA: Diagnosis not present

## 2023-12-23 DIAGNOSIS — E782 Mixed hyperlipidemia: Secondary | ICD-10-CM

## 2023-12-23 NOTE — Patient Instructions (Signed)
Follow-Up: At [facility], you and your health needs are our priority.  As part of our continuing mission to provide you with exceptional heart care, we have created designated Provider Care Teams.  These Care Teams include your primary Cardiologist (physician) and Advanced Practice Providers (APPs -  Physician Assistants and Nurse Practitioners) who all work together to provide you with the care you need, when you need it.  We recommend signing up for the patient portal called "MyChart".  Sign up information is provided on this After Visit Summary.  MyChart is used to connect with patients for Virtual Visits (Telemedicine).  Patients are able to view lab/test results, encounter notes, upcoming appointments, etc.  Non-urgent messages can be sent to your provider as well.   To learn more about what you can do with MyChart, go to [email or web address].    Your next appointment:   Upcoming appt  Provider:   Dr. Rennis Golden  Other Instructions

## 2023-12-26 ENCOUNTER — Ambulatory Visit: Payer: Medicare HMO

## 2024-01-16 ENCOUNTER — Ambulatory Visit: Payer: Medicare HMO

## 2024-01-19 ENCOUNTER — Encounter (HOSPITAL_BASED_OUTPATIENT_CLINIC_OR_DEPARTMENT_OTHER): Payer: Self-pay

## 2024-01-19 ENCOUNTER — Encounter: Payer: Self-pay | Admitting: Cardiovascular Disease

## 2024-01-19 LAB — NMR, LIPOPROFILE
Cholesterol, Total: 203 mg/dL — ABNORMAL HIGH (ref 100–199)
HDL Particle Number: 44.7 umol/L (ref >=30.5)
HDL-C: 69 mg/dL (ref >39)
LDL Particle Number: 1261 nmol/L — ABNORMAL HIGH (ref <1000)
LDL Size: 21.6 nmol (ref >20.5)
LDL-C (NIH Calc): 115 mg/dL — ABNORMAL HIGH (ref 0–99)
LP-IR Score: 33 (ref <=45)
Small LDL Particle Number: 330 nmol/L (ref <=527)
Triglycerides: 109 mg/dL (ref 0–149)

## 2024-01-19 LAB — LIPOPROTEIN A (LPA): Lipoprotein (a): 716.2 nmol/L — ABNORMAL HIGH (ref <75.0)

## 2024-01-24 ENCOUNTER — Ambulatory Visit (HOSPITAL_BASED_OUTPATIENT_CLINIC_OR_DEPARTMENT_OTHER): Payer: Medicare HMO | Admitting: Internal Medicine

## 2024-01-24 ENCOUNTER — Encounter (HOSPITAL_BASED_OUTPATIENT_CLINIC_OR_DEPARTMENT_OTHER): Payer: Self-pay | Admitting: Internal Medicine

## 2024-01-24 VITALS — BP 128/76 | HR 60 | Ht 62.0 in | Wt 175.0 lb

## 2024-01-24 DIAGNOSIS — T466X5D Adverse effect of antihyperlipidemic and antiarteriosclerotic drugs, subsequent encounter: Secondary | ICD-10-CM | POA: Diagnosis not present

## 2024-01-24 DIAGNOSIS — Z5320 Procedure and treatment not carried out because of patient's decision for unspecified reasons: Secondary | ICD-10-CM | POA: Diagnosis not present

## 2024-01-24 DIAGNOSIS — R931 Abnormal findings on diagnostic imaging of heart and coronary circulation: Secondary | ICD-10-CM | POA: Diagnosis not present

## 2024-01-24 DIAGNOSIS — M791 Myalgia, unspecified site: Secondary | ICD-10-CM

## 2024-01-24 DIAGNOSIS — E7841 Elevated Lipoprotein(a): Secondary | ICD-10-CM | POA: Diagnosis not present

## 2024-01-24 NOTE — Progress Notes (Signed)
 LIPID CLINIC CONSULT NOTE  Chief Complaint:  Elevated LP(a)  Primary Care Physician: Lupita Raider, MD  Primary Cardiologist:  Kristeen Miss, MD  HPI:  Bridget Salas is a 68 y.o. female who is being seen today for the evaluation of elevated LP(a) at the request of Lupita Raider, MD. this is a 68 year old female kindly referred for evaluation of elevated LP(a).  She has a longstanding history of high cholesterol with most recent lipid profile in our system showing total cholesterol 268, triglycerides 114, HDL 56 and LDL 192.  Her LP(a) was assessed earlier this year and was elevated 211 nmol/L.  She had investigated a clinical research trial to lower this but was not considered a candidate.  Subsequently she had repeat LP(a) testing for unknown reasons and her LP(a) was now 649 nmol/L.  This is extremely elevated and seems unusual to have increased so much.  There is actually a fairly small amount of literature on this.  There are some investigators that believe there is about 20% of LP(a) that may be linked to not genetic factors but generally the LP(a) is stable for most patients.  Those nongenetic factors could include things like hyper or hypothyroidism, chronic kidney disease including of nephrotic syndrome and other factors.  She has not been on statin therapy due to side effects with rosuvastatin in the past which caused muscle pain.  She currently is on red yeast rice and a number of supplements which she seems to tolerate.  She did have an abnormal calcium score 73.2, 76 percentile for age and sex matched controls.  She is lost a significant amount of weight however her cholesterol still remains elevated.  She does have a number of concerns and hesitancy about using new medications and particularly injectables.  She is also inquiring about any upcoming clinical trials.  01/24/2024  Ms. Kable returns today for follow-up.  She underwent injection of Leqvio a few months ago.   Unfortunately she had described significant side effects including muscle aches, arthralgias and myalgias, fatigue and generalized feeling of unwell which she says still persists.  She says she is a very healthy person and never gets sick and feels that this was likely due to the medication although there are very few reports of these kind of side effects with it.  More concerning was that her LP(a) actually went up to 716.2 nmol/L.  Previously it was 649 nmol/L.  About a year ago her LP(a) was 211 and she had some independent testing which showed it was 130 nmol/L but prior to that it was in the 400 range.  She feels that a combination of over-the-counter treatments that she used help to lower LPA although there is little evidence that that would be beneficial.  She also talked about possibly starting niacin today.  The only other therapies that we know lower LP(a) from randomized, placebo controlled studies are the PCSK9 antibody inhibitors.  This would include Repatha and Praluent.  She is hesitant to try these given her side effects with the Leqvio.  She does not want to have her subsequent Leqvio injection in March.  Given her possible side effects I advised against it.  We also talked today about the possibility of a clinical trial regarding LP(a) lowering.  While she may qualify based on her level of LP(a), she does need to be on lipid-lowering therapy to qualify which she is currently not.  PMHx:  Past Medical History:  Diagnosis Date   Headache(784.0)  Hypertension    Kidney stones    passed stone - no surgery   PONV (postoperative nausea and vomiting)    (No issues since weight loss.)   SVD (spontaneous vaginal delivery)    x 3    Past Surgical History:  Procedure Laterality Date   ABDOMINAL HYSTERECTOMY     BACK SURGERY     L4-l5   CATARACT EXTRACTION W/PHACO Right 10/05/2023   Procedure: CATARACT EXTRACTION PHACO AND INTRAOCULAR LENS PLACEMENT (IOC) RIGHT  CLAREON TORIC LENS 5.90  00:30.8;  Surgeon: Lockie Mola, MD;  Location: [facility];  Service: Ophthalmology;  Laterality: Right;   CATARACT EXTRACTION W/PHACO Left 12/07/2023   Procedure: CATARACT EXTRACTION PHACO AND INTRAOCULAR LENS PLACEMENT (IOC) LEFT CLAREON TORIC 4.54, 00:46.4;  Surgeon: Lockie Mola, MD;  Location: [facility];  Service: Ophthalmology;  Laterality: Left;   COLONOSCOPY     CYSTOCELE REPAIR N/A 05/29/2014   Procedure: ANTERIOR REPAIR (CYSTOCELE) with wide  local excision;  Surgeon: Geryl Rankins, MD;  Location: [facility];  Service: Gynecology;  Laterality: N/A;   JOINT REPLACEMENT     left knee   TUBAL LIGATION      FAMHx:  Family History  Problem Relation Age of Onset   Hypertension Father     SOCHx:   reports that she has never smoked. She has never used smokeless tobacco. She reports that she does not drink alcohol and does not use drugs.  ALLERGIES:  Allergies  Allergen Reactions   Aleve [Naproxen Sodium]     Pt stated, "gave me sores in my mouth"   Mobic [Meloxicam] Hives   Rosuvastatin     Other reaction(s): muscle pain    ROS: Pertinent items noted in HPI and remainder of comprehensive ROS otherwise negative.  HOME MEDS: Current Outpatient Medications on File Prior to Visit  Medication Sig Dispense Refill   chlorthalidone (HYGROTON) 25 MG tablet TAKE 1 TABLET (25 MG TOTAL) BY MOUTH DAILY. 90 tablet 2   Coenzyme Q10 (Q-SORB CO Q-10) 100 MG capsule Take 200 mg by mouth daily.     CYSTEINE PO Take 1,200 mg by mouth daily. N-Acetyl-L-Cysteine     Inclisiran Sodium (LEQVIO [place]) Inject into the skin.     Melatonin 10 MG/ML LIQD Take 2 mLs by mouth daily.     Menaquinone-7 (VITAMIN K2 PO) Take 1 tablet by mouth daily.     milk thistle 175 MG tablet Take 175 mg by mouth daily.     NON FORMULARY Isagenix products     OVER THE COUNTER MEDICATION daily. Z Pack: zinc 30 mg, Quercitin 500 mg, vitamin c 800 mg, vitamin d 125 mg     potassium chloride  (KLOR-CON) 10 MEQ tablet Take 1 tablet (10 mEq total) by mouth daily. 90 tablet 2   Red Yeast Rice 600 MG TABS Take 1,200 mg by mouth at bedtime.     SUMAtriptan (IMITREX) 100 MG tablet Take 100 mg by mouth every 2 (two) hours as needed for migraine or headache. May repeat in 2 hours if headache persists or recurs.     valsartan (DIOVAN) 80 MG tablet TAKE 1 TABLET BY MOUTH EVERY DAY 90 tablet 3   Zinc 50 MG TABS 1 tablet Orally Once a day     No current facility-administered medications on file prior to visit.    LABS/IMAGING: No results found for this or any previous visit (from the past 48 hours). No results found.  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  Unknown as to why CAC score 73.2, 76 percentile (10/2021) Statin intolerance-myalgias, on red yeast rice  PLAN: 1.   Ms. Vig feels that she had a reaction to [facility] and will not continue on the medication.  Her only other options that have shown evidence to lower LP(a) are the antibody PCSK9 inhibitors which she is hesitant to use at this time.  She wants to continue with her current over-the-counter therapies and consider adding niacin.  To get some benefit with lowering LP(a) she would probably have to take between 2 to 3000 mg a day.  She would be advised to increase this slowly by 500 mg every other  week using an over-the-counter flush Niacin.  She could also consider taking low-dose aspirin 81 mg 30 to 60 minutes prior to this to ameliorate the side effects.  Current guidelines however do not recommend niacin for treatment of lowering LP(a).  I am happy to see her back as needed if she elects to try additional therapy such as the antibody PCSK9 inhibitors.  TIME SPENT WITH PATIENT: 45 minutes of direct patient care. More than 50% of that time was spent on coordination of care and counseling regarding LP(a), options for lipid-lowering therapy, possible side effects related to [facility], management options including over-the-counter niacin.   Chrystie Nose, MD, [facility], FACP  [place]  [facility]  Medical Director of the Advanced Lipid Disorders &  Cardiovascular Risk Reduction Clinic Diplomate of the American Board of Clinical Lipidology Attending Cardiologist  Direct Dial: [phone]  Fax: [phone]  Website:  [email or web address]  Lisette Abu Lashan Macias 01/24/2024, 1:32 PM

## 2024-01-24 NOTE — Patient Instructions (Signed)
 NO FLUSH NIACIN over-the-counter  Slowly increase dose every 1-2 weeks up to 2000mg  If issues with med, take aspirin 81mg  30-60 minutes before dose  Follow up as needed

## 2024-01-27 NOTE — Telephone Encounter (Signed)
 Patient identification verified by 2 forms. Shade Flood, RN     LDVM with provider response regarding niacin and aspirin.  Chrystie Nose, MD  Ellene Route minutes ago (8:28 AM)    I would take it daily for a week, then increase it to 2 tablets daily for another week - if tolerated, you would try to increase up to at least 4 tablets daily. You could take low dose aspirin 81 mg - 30 to 60 minutes prior if you are having side effects to try to prevent them.   Dr Rexene Edison  This MyChart message has not been read.

## 2024-01-30 ENCOUNTER — Ambulatory Visit: Payer: Medicare HMO

## 2024-02-29 ENCOUNTER — Other Ambulatory Visit: Payer: Self-pay | Admitting: Cardiovascular Disease

## 2024-03-09 ENCOUNTER — Other Ambulatory Visit: Payer: Self-pay | Admitting: Cardiovascular Disease

## 2024-03-09 DIAGNOSIS — Z79899 Other long term (current) drug therapy: Secondary | ICD-10-CM

## 2024-03-09 DIAGNOSIS — I10 Essential (primary) hypertension: Secondary | ICD-10-CM

## 2024-03-21 DIAGNOSIS — B029 Zoster without complications: Secondary | ICD-10-CM | POA: Diagnosis not present

## 2024-05-25 ENCOUNTER — Encounter: Payer: Self-pay | Admitting: Cardiovascular Disease

## 2024-06-25 DIAGNOSIS — H35372 Puckering of macula, left eye: Secondary | ICD-10-CM | POA: Diagnosis not present

## 2024-06-25 DIAGNOSIS — Z961 Presence of intraocular lens: Secondary | ICD-10-CM | POA: Diagnosis not present

## 2024-06-25 DIAGNOSIS — H26491 Other secondary cataract, right eye: Secondary | ICD-10-CM | POA: Diagnosis not present

## 2024-06-25 DIAGNOSIS — H35341 Macular cyst, hole, or pseudohole, right eye: Secondary | ICD-10-CM | POA: Diagnosis not present

## 2024-06-28 DIAGNOSIS — H26491 Other secondary cataract, right eye: Secondary | ICD-10-CM | POA: Diagnosis not present

## 2024-08-03 ENCOUNTER — Encounter (HOSPITAL_BASED_OUTPATIENT_CLINIC_OR_DEPARTMENT_OTHER): Payer: Self-pay

## 2024-08-06 ENCOUNTER — Encounter (HOSPITAL_BASED_OUTPATIENT_CLINIC_OR_DEPARTMENT_OTHER): Payer: Self-pay | Admitting: Internal Medicine

## 2024-08-30 ENCOUNTER — Other Ambulatory Visit: Payer: Self-pay | Admitting: Obstetrics and Gynecology

## 2024-08-30 DIAGNOSIS — Z1231 Encounter for screening mammogram for malignant neoplasm of breast: Secondary | ICD-10-CM

## 2024-08-30 DIAGNOSIS — Z87412 Personal history of vulvar dysplasia: Secondary | ICD-10-CM | POA: Diagnosis not present

## 2024-08-30 DIAGNOSIS — Z9189 Other specified personal risk factors, not elsewhere classified: Secondary | ICD-10-CM | POA: Diagnosis not present

## 2024-09-07 ENCOUNTER — Ambulatory Visit

## 2024-09-10 ENCOUNTER — Ambulatory Visit (HOSPITAL_BASED_OUTPATIENT_CLINIC_OR_DEPARTMENT_OTHER): Admitting: Nurse Practitioner

## 2024-09-10 ENCOUNTER — Encounter (HOSPITAL_BASED_OUTPATIENT_CLINIC_OR_DEPARTMENT_OTHER): Payer: Self-pay | Admitting: Nurse Practitioner

## 2024-09-10 VITALS — BP 116/58 | HR 73 | Ht 62.5 in | Wt 172.1 lb

## 2024-09-10 DIAGNOSIS — E7800 Pure hypercholesterolemia, unspecified: Secondary | ICD-10-CM | POA: Diagnosis not present

## 2024-09-10 DIAGNOSIS — T466X5D Adverse effect of antihyperlipidemic and antiarteriosclerotic drugs, subsequent encounter: Secondary | ICD-10-CM

## 2024-09-10 DIAGNOSIS — Z7189 Other specified counseling: Secondary | ICD-10-CM

## 2024-09-10 DIAGNOSIS — R001 Bradycardia, unspecified: Secondary | ICD-10-CM

## 2024-09-10 DIAGNOSIS — M791 Myalgia, unspecified site: Secondary | ICD-10-CM | POA: Diagnosis not present

## 2024-09-10 DIAGNOSIS — I10 Essential (primary) hypertension: Secondary | ICD-10-CM | POA: Diagnosis not present

## 2024-09-10 DIAGNOSIS — E7841 Elevated Lipoprotein(a): Secondary | ICD-10-CM

## 2024-09-10 DIAGNOSIS — R931 Abnormal findings on diagnostic imaging of heart and coronary circulation: Secondary | ICD-10-CM

## 2024-09-10 DIAGNOSIS — Z79899 Other long term (current) drug therapy: Secondary | ICD-10-CM

## 2024-09-10 NOTE — Patient Instructions (Signed)
 Medication Instructions:   Your physician recommends that you continue on your current medications as directed. Please refer to the Current Medication list given to you today.   *If you need a refill on your cardiac medications before your next appointment, please call your pharmacy*  Lab Work:  None ordered.  If you have labs (blood work) drawn today and your tests are completely normal, you will receive your results only by: MyChart Message (if you have MyChart) OR A paper copy in the mail If you have any lab test that is abnormal or we need to change your treatment, we will call you to review the results.  Testing/Procedures:    Your cardiac CT will be scheduled at one of the below locations:    [facility] [address]  [place], [place] [address] [phone]   If scheduled at the Heart and Vascular Tower at [facility] street, please enter the parking lot using the [address] entrance and use the FREE valet service at the patient drop-off area. Enter the building and check-in with registration on the main floor.   Please follow these instructions carefully (unless otherwise directed):  An IV will be required for this test and Nitroglycerin will be given.    On the Night Before the Test: Be sure to Drink plenty of water. Do not consume any caffeinated/decaffeinated beverages or chocolate 12 hours prior to your test. Do not take any antihistamines 12 hours prior to your test.   On the Day of the Test: Drink plenty of water until 1 hour prior to the test. Do not eat any food 1 hour prior to test. You may take your regular medications prior to the test.  If you take Chlorthalidone , please HOLD on the morning of the test. Patients who wear a continuous glucose monitor MUST remove the device prior to scanning. FEMALES- please wear underwire-free bra if available, avoid dresses & tight clothing  After the Test: Drink plenty of  water. After receiving IV contrast, you may experience a mild flushed feeling. This is normal. On occasion, you may experience a mild rash up to 24 hours after the test. This is not dangerous. If this occurs, you can take Benadryl  25 mg, Zyrtec, Claritin, or Allegra and increase your fluid intake. (Patients taking Tikosyn should avoid Benadryl , and may take Zyrtec, Claritin, or Allegra) If you experience trouble breathing, this can be serious. If it is severe call 911 IMMEDIATELY. If it is mild, please call our office.  We will call to schedule your test 2-4 weeks out understanding that some insurance companies will need an authorization prior to the service being performed.   For more information and frequently asked questions, please visit our website : [email or web address]  For non-scheduling related questions, please contact the cardiac imaging nurse navigator should you have any questions/concerns: Cardiac Imaging Nurse Navigators Direct Office Dial: [phone]   For scheduling needs, including cancellations and rescheduling, please call [place], [phone].   Follow-Up: At [facility], you and your health needs are our priority.  As part of our continuing mission to provide you with exceptional heart care, our providers are all part of one team.  This team includes your primary Cardiologist (physician) and Advanced Practice Providers or APPs (Physician Assistants and Nurse Practitioners) who all work together to provide you with the care you need, when you need it.  Your next appointment:   6 month(s)  Provider:   Rosaline Bane, NP  We recommend signing up for the patient portal called MyChart.  Sign up information is provided on this After Visit Summary.  MyChart is used to connect with patients for Virtual Visits (Telemedicine).  Patients are able to view lab/test results, encounter notes, upcoming appointments, etc.  Non-urgent messages can be sent  to your provider as well.   To learn more about what you can do with MyChart, go to [email or web address].   Other Instructions  Your physician wants you to follow-up in: 6 months.  You will receive a reminder letter in the mail two months in advance. If you don't receive a letter, please call our office to schedule the follow-up appointment.

## 2024-09-10 NOTE — Progress Notes (Signed)
 Cardiology Office Note   Date:  09/11/2024  ID:  Bridget Salas, DOB November 16, 1956, MRN [id number] PCP: Loreli Kins, MD  [facility] Providers Cardiologist:  Aleene Passe, MD (Inactive)     PMH Dyslipidemia Elevated LP(a) Statin myalgia Leqvio  intolerance Hypertension Aortic atherosclerosis Coronary artery calcification CT Calcium  score 10/30/2021 CAC score 73.2 (76th percentile) Ectatic thoracic aorta  Referred to cardiology and seen by Dr. Passe for management of hypertension 05/2021.  Was walking frequently for exercise and avoiding high sodium foods.  She had achieved significant weight loss, weight was up to 256 lb in 2015.  CT calcium  score 73.2 (76 percentile) December 2022.  She was referred to advanced lipid disorder clinic and seen by Dr. Mona.  Recommendation to repeat CT in 2 to 3 years to follow-up ectatic thoracic aorta.  Significantly elevated LP(a) initially at 211 nmol/L, later measured at 649 nmol/L.  There was concern for hyper or hypothyroidism, CKD, nephrotic syndrome, or other factors that may increase LP(a).  She was placed on Leqvio  but unfortunately had side effects including muscle aches, arthralgias, myalgias, fatigue, and general unwell feeling.  Discussion pursued regarding starting PCSK9 inhibitor, however she wanted to continue over-the-counter treatment options considering niacin.  She was advised to slowly increase niacin by 500 mg every other week for total dose of 2000-3000 milligrams daily.  She was encouraged to start aspirin 81 mg daily to ameliorate potential side effects.  History of Present Illness Discussed the use of AI scribe software for clinical note transcription with the patient, who gave verbal consent to proceed.  History of Present Illness Bridget Salas is a very pleasant 68 year old female with elevated lipoprotein(a) levels who presents for management of her cholesterol and cardiovascular risk.  Her lipoprotein a levels  have been checked routinely and have been as high as 710 mg/dL, with a recent level of 563 mg/dL.  She had what appears to be an outlier with a level that was < 8.4.  Most recent lipid profile includes a total cholesterol of 180 mg/dL, LDL of 892 mg/dL, and triglycerides of 887 mg/dL. She discontinued Leqvio  due to to adverse effects and currently tolerates niacin at 3000 mg without flushing. She is considering dietary changes and supplements to manage her cholesterol. Family history is significant for elevated lipoprotein(a) levels, with her sister also affected. Her mother had heart problems, cholesterol issues, diabetes, and was on dialysis. Another sister has elevated lipoprotein(a) levels, though not as high. She maintains a healthy lifestyle, having lost 100 pounds through diet and exercise. She engages in regular physical activity, including walking and stretching. Her diet is clean, focusing on homegrown herbs and vegetables, and she avoids breads and pastries. She drinks hibiscus tea and uses coconut sugar.  She denies chest pain, shortness of breath, orthopnea, PND, edema, palpitations, presyncope, syncope. Low resting heart rate as low as 38 bpm which does not cause symptoms. She previously tried statins but had adverse reactions. She would like to avoid prescription medication if possible. BP is well controlled.    ROS: See HPI  Studies Reviewed      Lipoprotein (a)  Date/Time Value Ref Range Status  01/17/2024 08:35 AM 716.2 (H) <75.0 nmol/L Final    Comment:    Note:  Values greater than or equal to 75.0 nmol/L may        indicate an independent risk factor for CHD,        but must be evaluated with caution when applied  to non-Caucasian populations due to the        influence of genetic factors on Lp(a) across        ethnicities.     Risk Assessment/Calculations           Physical Exam VS:  BP (!) 116/58 (BP Location: Right Arm, Patient Position: Sitting, Cuff Size:  Normal)   Pulse 73   Ht 5' 2.5 (1.588 m)   Wt 172 lb 1.6 oz (78.1 kg)   SpO2 95%   BMI 30.98 kg/m    Wt Readings from Last 3 Encounters:  09/10/24 172 lb 1.6 oz (78.1 kg)  01/24/24 175 lb (79.4 kg)  12/23/23 167 lb 12.8 oz (76.1 kg)    GEN: Well nourished, well developed in no acute distress NECK: No JVD; No carotid bruits CARDIAC: RRR, no murmurs, rubs, gallops RESPIRATORY:  Clear to auscultation without rales, wheezing or rhonchi  ABDOMEN: Soft, non-tender, non-distended EXTREMITIES:  No edema; No deformity    Assessment & Plan Elevated lipoprotein(a)  Dyslipidemia LDL goal < 55  Leqvio  intolerance Statin intolerance  Chronic elevation of Lp(a) with hypercholesterolemia and small dense LDL particles is noted. Her recent Lp(a) reading was 563. She pays to have LP(a) and lipid profiles done routinely to monitor effectiveness of various supplements. The genetic nature of Lp(a) and management challenges were discussed.  We discussed the lack of evidence/research associated with management of dyslipidemia with supplements. She is considering Zetia as her sister is tolerating this medication without side effects. Information on Nexletol and Repatha and Praluent given for her review.   Consider Zetia and Nexletol for further LDL management.  - Consider starting Zetia, Nexletol, Repatha or Praluent for LDL goal < 55 - Continue niacin   Coronary artery calcification Cardiac risk CT calcium  score 10/2021 of 73.2 (76 percentile), with bulk of calcification in LAD.  Family history of heart disease and personal history of uncontrolled hyperlipidemia, elevated LP(a) and hypertension. She denies chest pain, dyspnea, or other symptoms concerning for angina.  However, based on her elevated risk and family history, we will proceed with ischemia evaluation. - Coronary CTA for evaluation of ischemia and mapping of coronary anatomy - History of bradycardia, so will hold lopressor - Consider  non-statin therapies for LDL goal < 55  Hypertension BP is well controlled.  SCr 1.36, K+ 3.7 on labs completed 915//25, stable in comparison to previous. She feels well on current anti-hypertensive therapy including valsartan  and chlorthalidone . She closely monitors renal function which she feels is stable. No changes to antihypertensive therapy today.  - Continue valsartan  and chlorthalidone   Bradycardia   Bradycardia with a resting heart rate occasionally at 38 bpm is asymptomatic. HR is stable in clinic.  - Continue routine home monitoring - Reviewed symptoms to report         Dispo: 6 months with me (per pt request)  Signed, Rosaline Bane, NP-C

## 2024-09-17 ENCOUNTER — Ambulatory Visit
Admission: RE | Admit: 2024-09-17 | Discharge: 2024-09-17 | Disposition: A | Source: Ambulatory Visit | Attending: Obstetrics and Gynecology | Admitting: Obstetrics and Gynecology

## 2024-09-17 DIAGNOSIS — Z1231 Encounter for screening mammogram for malignant neoplasm of breast: Secondary | ICD-10-CM | POA: Diagnosis not present

## 2024-09-18 ENCOUNTER — Encounter (HOSPITAL_COMMUNITY): Payer: Self-pay

## 2024-09-20 ENCOUNTER — Ambulatory Visit (HOSPITAL_COMMUNITY)
Admission: RE | Admit: 2024-09-20 | Discharge: 2024-09-20 | Disposition: A | Discharge status: Home / Self Care | Source: Ambulatory Visit | Attending: Nurse Practitioner | Admitting: Nurse Practitioner

## 2024-09-20 DIAGNOSIS — T466X5A Adverse effect of antihyperlipidemic and antiarteriosclerotic drugs, initial encounter: Secondary | ICD-10-CM | POA: Insufficient documentation

## 2024-09-20 DIAGNOSIS — M791 Myalgia, unspecified site: Secondary | ICD-10-CM | POA: Insufficient documentation

## 2024-09-20 DIAGNOSIS — I2510 Atherosclerotic heart disease of native coronary artery without angina pectoris: Secondary | ICD-10-CM | POA: Diagnosis not present

## 2024-09-20 DIAGNOSIS — E7800 Pure hypercholesterolemia, unspecified: Secondary | ICD-10-CM | POA: Diagnosis present

## 2024-09-20 DIAGNOSIS — R931 Abnormal findings on diagnostic imaging of heart and coronary circulation: Secondary | ICD-10-CM | POA: Diagnosis present

## 2024-09-20 DIAGNOSIS — E7841 Elevated Lipoprotein(a): Secondary | ICD-10-CM | POA: Insufficient documentation

## 2024-09-20 MED ORDER — NITROGLYCERIN 0.4 MG SL SUBL
0.8000 mg | SUBLINGUAL_TABLET | Freq: Once | SUBLINGUAL | Status: AC
  Administered 2024-09-20: 0.8 mg via SUBLINGUAL

## 2024-09-20 MED ORDER — IOHEXOL 350 MG/ML SOLN
100.0000 mL | Freq: Once | INTRAVENOUS | Status: AC | PRN
  Administered 2024-09-20: 100 mL via INTRAVENOUS

## 2024-09-21 ENCOUNTER — Ambulatory Visit: Payer: Self-pay | Admitting: Nurse Practitioner

## 2024-10-12 ENCOUNTER — Encounter: Payer: Self-pay | Admitting: *Deleted

## 2024-10-12 DIAGNOSIS — Z006 Encounter for examination for normal comparison and control in clinical research program: Secondary | ICD-10-CM

## 2024-10-12 NOTE — Research (Signed)
 Spoke with Bridget Salas about pre-event research study. She requests more information. Emailed the consents to her to read over. Encouraged her to call with any questions.

## 2024-11-07 ENCOUNTER — Encounter

## 2024-11-07 DIAGNOSIS — Z006 Encounter for examination for normal comparison and control in clinical research program: Secondary | ICD-10-CM

## 2024-11-07 NOTE — Research (Signed)
 [facility] Lpa screening 07-Nov-2024        Lipids:  Lp(a) 740.0 nmol/L                    [x] Clinically Significant  [] Not Clinically Significant   Any further action needed to be taken per the PI?  No      Subject Name: Bridget Salas  Subject met inclusion and exclusion criteria.  The informed consent form, study requirements and expectations were reviewed with the subject and questions and concerns were addressed prior to the signing of the consent form.  The subject verbalized understanding of the trial requirements.  The subject agreed to participate in the pre-event LPa screening  trial and signed the informed consent at 0913 on 11-07-2024.  The informed consent was obtained prior to performance of any protocol-specific procedures for the subject.  A copy of the signed informed consent was given to the subject and a copy was placed in the subject's medical record.   Tevis Conger, [place]   Pre-event  INCLUSION  Yes No  Participant has provided written informed consent before initiation of any study-specific activities/procedures. [x]  []   Age >= 50 years at the time of signing of the Lp(a) screening ICF. [x]  []   Lp(a) >= 200 nmol/L during Lp(a) screening by a central laboratory using an investigational IVD test. At least 4 weeks of stable and optimized lipid-lowering therapy consistent with regional/local clinical practice guidelines or according to investigators judgment before Lp(a) screening.                                                                                                         _______________ Waiting on results    Participants meeting at least one of the following categories (A or B):  A. Multiple risk factors for atherosclerotic disease  1. Presence of >= 4 of any of the following ASCVD risk factors: a. Age >= 65 years men or women b. History of hypertension requiring pharmacotherapy c. Current cigarette tobacco smoking d. Diabetes mellitus  (Type 1 or Type 2) requiring pharmacotherapy e. Screening high-sensitivity C-reactive protein (hs-CRP) >= 2.0 mg/L by central laboratory f. Screening estimated glomerular filtration rate (eGFR) 30 to < 60 mL/min/1.73 m2 by central laboratory g. Familial hypercholesterolemia  or family history of premature ASCVD (1st degree relative before age 68 years for males, and/or 1st degree relative before age 81 years for females)  AND/OR  B. History of atherosclerosis evidenced by:  1. Coronary Artery Disease-Reporting and Data System (CAD-RADS) P3, and/or 2. Coronary artery calcium  (CAC) > 300, and/or                          CAC 162 3. Atherosclerosis defined by at least one of the following AND >= 2 additional risk factors listed in 104 A: a. Coronary atherosclerosis as evidenced by a stenosis >= 50% in at least one artery b. Coronary atherosclerosis as evidenced by a stenosis >= 25% (or reported as at least mild) in at least 2 arteries  detected on invasive or noninvasive imaging c. Coronary atherosclerosis as evidenced by a CAC score > 100 and/or CAD-RADS >= P2 d. Carotid atherosclerosis as evidenced by an internal carotid stenosis >= 50% e. Peripheral atherosclerosis as evidenced by >= 50% stenosis of limb artery and/or ankle brachial index < 0.9                                                                     Bold the above patient meets.  [x]  []   EXCLUSION Yes No  Prior acute atherothrombotic qualifying event at any time, defined as prior myocardial infarction, prior stroke, prior transient ischemic attack, or prior acute limb ischemia. []  [x]   Prior arterial revascularization at any time suspected to be associated with atherosclerosis. []  [x]   If available, participants without known atherosclerosis, CAC score = 0, CAD-RADS = 0, in the last 10 years prior to enrollment. []  [x]   Severe renal dysfunction, defined as an eGFR < 30 mL/min/1.73 m2 by central laboratory during  screening []  [x]   History of decompensated liver cirrhosis and/or screening aspartate aminotransferase (AST) or alanine aminotransferase (ALT) > 3 x upper limit of normal (ULN), or total bilirubin (TBL) > 2 x ULN (except in stable Gilberts syndrome). []  [x]   History of major bleeding disorder (eg, hemophilia, von Willebrand disease, clotting factor deficiencies, etc). []  [x]   Planned arterial revascularization (percutaneous or surgical). []  [x]   Fasting triglycerides > 400 mg/dL (5.47 mmol/L) during screening. []  [x]   Participant has known sensitivity to any of the products or components to be administered during dosing or the study procedures. []  [x]   Malignancy not in remission for at least 5 years prior to enrollment, exceptions for less than 5 years since remission include non-melanoma skin cancers, localized thyroid  cancer (papillary, follicular, and medullary), cervical in situ carcinoma, breast ductal carcinoma in situ, or stage 1 prostate carcinoma. []  [x]   Diagnosis of severe heart failure (New York  Heart Association Functional Classification IV), and/or if available, most recent left ventricular ejection fraction < 30%. []  [x]   Uncontrolled or recurrent ventricular tachycardia in the past 3 months before enrollment. []  [x]   Atrial fibrillation or flutter and not on an anticoagulant despite an indication to be anticoagulated.    Uncontrolled hypertension at screening, defined as systolic blood pressure > 180 mmHg or diastolic blood pressure > 110 mmHg at rest despite antihypertensive therapy. []  [x]   Diabetes mellitus (Type 1 or Type 2) with a hemoglobin A1C (HbA1c) >= 10% by central laboratory at screening. []  [x]   History or evidence of any other clinically significant disorder, condition, or disease (such as active infection) that, in the opinion of the investigator or [employer] physician, if consulted, would pose a risk to participant safety, or interfere with the study evaluation,  procedures or completion. []  [x]   Current or planned lipoprotein apheresis or < 3 months since last apheresis treatment before enrollment. []  [x]   Participant has taken a cholesteryl ester transfer protein inhibitor or lomitapide in the last 12 months before enrollment. []  [x]   Previously received any therapy specifically targeting Lp(a) including but not limited to, olpasiran, pelacarsen, lepodisiran, zerlasiran, or muvalaplin []  [x]    Currently receiving treatment in another investigational device or drug study, or less than 30 days since ending treatment  on another investigational device or drug study(ies). Other investigational procedures while participating in this study are excluded. []  [x]   Participants of childbearing potential unwilling to use protocol-specified method of contraception during treatment and for an additional 30 days after the last dose of investigational product.  []  [x]   Participants who are breastfeeding or who plan to breastfeed while on study through 30 days after the last dose of investigational product []  [x]   Participants planning to become pregnant while on study through 30 days after the last dose of investigational product.  []  [x]   Participants of childbearing potential with a positive pregnancy test assessed at screening and/or day 1 by a highly sensitive urine or serum pregnancy test. []  [x]   Participant likely to not be available to complete all protocol-required study visits or procedures, and/or to comply with all required study procedures (eg, Clinical Outcome Assessments) to the best of the Participant and investigators knowledge.                         []  [x]     Pre-Event Lp(a) Screening Visit  Date of Visit: 11-07-2024              Subject Number:[id number]  During this visit the following activities were completed:  [x] Reading, Signing and Understanding the informed Consent for Lp(a) screening  [x] Demographics  [x] Medical  History  [x] Adverse events  [x] Serious adverse device effects  [x] Concomitant therapies reviewed  [x] Central labs      [x] Lp(a)   Ms Christoffel is here for pre-event LPa screening visit. She reports no trips to the Ed or urgent care lately. She voices that she can't remember when her mother started having heart trouble. She was either in her 68's or 76's when she had triple bypass surgery. Ms Blumberg also had CT Coronary done 09-20-2024 which shows a score of 162.  She was placed in a quit place and  given time to ask questions about the consent. Blood was drawn at 0920. Informed her I will call her once I get the results of her Lpa back. Voices understanding.    Current Outpatient Medications:    chlorthalidone  (HYGROTON ) 25 MG tablet, TAKE 1 TABLET (25 MG TOTAL) BY MOUTH DAILY., Disp: 90 tablet, Rfl: 2   Coenzyme Q10 (Q-SORB CO Q-10) 100 MG capsule, Take 200 mg by mouth daily., Disp: , Rfl:    CYSTEINE PO, Take 1,200 mg by mouth daily. N-Acetyl-L-Cysteine, Disp: , Rfl:    Melatonin 10 MG/ML LIQD, Take 2 mLs by mouth daily., Disp: , Rfl:    Menaquinone-7 (VITAMIN K2 PO), Take 1 tablet by mouth daily., Disp: , Rfl:    milk thistle 175 MG tablet, Take 175 mg by mouth daily., Disp: , Rfl:    niacin (NIASPAN) 1000 MG CR tablet, Take 3,000 mg by mouth at bedtime., Disp: , Rfl:    NON FORMULARY, [employer] products, Disp: , Rfl:    OVER THE COUNTER MEDICATION, daily. Z Pack: zinc 30 mg, Quercitin 500 mg, vitamin c 800 mg, vitamin d 125 mg, Disp: , Rfl:    potassium chloride  (KLOR-CON ) 10 MEQ tablet, TAKE 1 TABLET BY MOUTH EVERY DAY, Disp: 90 tablet, Rfl: 3   Red Yeast Rice 600 MG TABS, Take 1,200 mg by mouth at bedtime., Disp: , Rfl:    SUMAtriptan  (IMITREX ) 100 MG tablet, Take 100 mg by mouth every 2 (two) hours as needed for migraine or headache. May repeat in 2 hours if headache persists or  recurs. (Patient not taking: Reported on 09/10/2024), Disp: , Rfl:    valsartan  (DIOVAN ) 80 MG tablet,  TAKE 1 TABLET BY MOUTH EVERY DAY, Disp: 90 tablet, Rfl: 2   Zinc 50 MG TABS, 1 tablet Orally Once a day, Disp: , Rfl:

## 2024-11-09 DIAGNOSIS — H43812 Vitreous degeneration, left eye: Secondary | ICD-10-CM | POA: Diagnosis not present

## 2024-11-09 DIAGNOSIS — H35341 Macular cyst, hole, or pseudohole, right eye: Secondary | ICD-10-CM | POA: Diagnosis not present

## 2024-11-09 DIAGNOSIS — Z961 Presence of intraocular lens: Secondary | ICD-10-CM | POA: Diagnosis not present

## 2024-11-14 ENCOUNTER — Encounter: Payer: Self-pay | Admitting: *Deleted

## 2024-11-14 DIAGNOSIS — Z006 Encounter for examination for normal comparison and control in clinical research program: Secondary | ICD-10-CM

## 2024-11-14 NOTE — Research (Signed)
 Spoke with Bridget Salas to inform her of Lpa results. [phone] scheduled her for main screening Jan 7 at 0830

## 2024-12-05 ENCOUNTER — Encounter: Admitting: *Deleted

## 2024-12-05 VITALS — BP 109/61 | HR 58 | Temp 97.9°F | Resp 16 | Ht 62.0 in | Wt 161.8 lb

## 2024-12-05 DIAGNOSIS — Z006 Encounter for examination for normal comparison and control in clinical research program: Secondary | ICD-10-CM

## 2024-12-05 NOTE — Research (Addendum)
 Bridget Salas                                 Chemistry: Glucose 106   mg/dL    [] Clinically Significant  [x] Not Clinically Significant  Creatinine 1.15 mg/d L [] Clinically Significant  [x] Not Clinically Significant     Hematology: Hemoglobin   g/dL           [] Clinically Significant  [x] Not Clinically Significant MCH   pg                         [] Clinically Significant  [x] Not Clinically Significant MCHC   g/dL                    [] Clinically Significant  [x] Not Clinically Significant    Lipids:  LDL-C 140 mg/dL         [] Clinically Significant  [x] Not Clinically Significant  NonHDL Chol 166 mg/dL  [] Clinically Significant  [x] Not Clinically Significant  HDLC4 62 mg/dL         [] Clinically Significant  [x] Not Clinically Significant   Any further action needed to be taken per the PI? No  Bridget KYM Maxcy, MD, [facility], FNLA, FACP  [place]  [facility]  Medical Director of the Advanced Lipid Disorders &  Cardiovascular Risk Reduction Clinic Diplomate of the American Board of Clinical Lipidology Attending Cardiologist  Direct Dial: [phone]  Fax: [phone]  Website:  [email or web address]   Subject Name: Bridget Salas  Subject met inclusion and exclusion criteria.  The informed consent form, study requirements and expectations were reviewed with the subject and questions and concerns were addressed prior to the signing of the consent form.  The subject verbalized understanding of the trial requirements.  The subject agreed to participate in the pre-event  trial and signed the informed consent at 0838 on 05-Dec-2024.  The informed consent was obtained prior to performance of any protocol-specific procedures for the subject.  A copy of the signed informed consent was given to the subject and a copy was placed in the subject's medical record.   Bridget Salas   Pre-Event Main Screening  Date of Visit: 05-Dec-2024              Subject Number:[id number]  During this visit the following activities were completed:  [x]  Reading, Signing and Understanding the informed Consent   [x]  Medical History                                          [x]  Smoking History (Prior, or concurrent use) none  [x]  Vital signs     Jmf:ozqu                                 [x]  Adverse events      BP:109/61      HR:58                                                          [x]  Serious adverse device effects      Resp: 16  Temp: 36.6                                                   [x]  Concomitant therapies reviewed      O2 Sat:97%      Height: 5 ft 2 in                                                      Weight:161.8 lbs      Waist circumference:37 in   [x]  Central Labs        []  Urine Preg test        []  Serum Preg test        []  FSH (only if needed to determine childbearing potential)         [x]  Fasting glucose         [x]  HbA1c        [x]  Fasting lipid panel & apolipoproteins        [x]  Coag        [x]  Chemistry        [x]  Hematology        [x]  Hs-CRP  Bridget Salas is here for main screening of pre-event. She was placed in a quite room, given time to read over the consent, and ask questions.  She reports no visits to the ed or urgent care since last seen. No changes in her meds. No pain. Blood was drawn at 0900. Vs taken at 0845. Informed her I will call her once I get labs back and she if we can move forward in the study. Voices understanding.    Current Medications[1]      [1]  Current Outpatient Medications:    ASTAXANTHIN PO, Take 1 tablet by mouth daily., Disp: , Rfl:    chlorthalidone  (HYGROTON ) 25 MG tablet, TAKE 1 TABLET (25 MG TOTAL) BY MOUTH DAILY., Disp: 90 tablet, Rfl: 2   Coenzyme Q10 (Q-SORB CO Q-10) 100 MG capsule, Take 200 mg by mouth daily., Disp: , Rfl:    CYSTEINE PO, Take 1,200 mg by mouth daily. N-Acetyl-L-Cysteine,  Disp: , Rfl:    Melatonin 10 MG/ML LIQD, Take 2 mLs by mouth daily., Disp: , Rfl:    Menaquinone-7 (VITAMIN K2 PO), Take 1 tablet by mouth daily., Disp: , Rfl:    milk thistle 175 MG tablet, Take 175 mg by mouth daily., Disp: , Rfl:    niacin (NIASPAN) 1000 MG CR tablet, Take 3,000 mg by mouth at bedtime., Disp: , Rfl:    NON FORMULARY, Isagenix products, Disp: , Rfl:    OVER THE COUNTER MEDICATION, daily. Z Pack: zinc 30 mg, Quercitin 500 mg, vitamin c 800 mg, vitamin d 125 mg, Disp: , Rfl:    Pine Bark, Pinus pinaster, 50 MG CAPS, Take 50 mg by mouth daily., Disp: , Rfl:    potassium chloride  (KLOR-CON ) 10 MEQ tablet, TAKE 1 TABLET BY MOUTH EVERY DAY, Disp: 90 tablet, Rfl: 3   Red Yeast Rice 600 MG TABS, Take 1,200 mg by mouth at bedtime., Disp: , Rfl:    valsartan  (DIOVAN ) 80 MG tablet, TAKE 1 TABLET BY MOUTH EVERY DAY, Disp: 90 tablet, Rfl: 2  Zinc 50 MG TABS, 1 tablet Orally Once a day, Disp: , Rfl:    SUMAtriptan  (IMITREX ) 100 MG tablet, Take 100 mg by mouth every 2 (two) hours as needed for migraine or headache. May repeat in 2 hours if headache persists or recurs. (Patient not taking: Reported on 12/05/2024), Disp: , Rfl:

## 2024-12-24 ENCOUNTER — Ambulatory Visit: Admitting: Student in an Organized Health Care Education/Training Program

## 2024-12-26 ENCOUNTER — Encounter: Admitting: *Deleted

## 2024-12-26 ENCOUNTER — Encounter: Payer: Self-pay | Admitting: *Deleted

## 2024-12-26 VITALS — BP 131/73 | HR 55 | Temp 98.1°F | Resp 16

## 2024-12-26 DIAGNOSIS — Z006 Encounter for examination for normal comparison and control in clinical research program: Secondary | ICD-10-CM

## 2024-12-26 MED ORDER — STUDY - OCEAN(A)-PRE-EVENT - OLPASIRAN 142 MG/ML OR PLACEBO SQ INJECTION (PI-HILTY)
142.0000 mg | PREFILLED_SYRINGE | SUBCUTANEOUS | Status: AC
  Administered 2024-12-26: 142 mg via SUBCUTANEOUS
  Filled 2024-12-26: qty 1

## 2024-12-26 NOTE — Research (Cosign Needed Addendum)
 "                   Chemistry Panel Total Bili <0.2 mg/dL  [] Clinically Significant  [x] Not Clinically Significant  Creatinine 1.21 mg/dL   [] Clinically Significant  [x] Not Clinically Significant  Urea Nitr 29 mg/dL  [] Clinically Significant  [x] Not Clinically Significant  NonHDLChol 176 mg/dL  [] Clinically Significant  [x] Not Clinically Significant  Apo B 118.0 mg/dL                             [] Clinically Significant  [x] Not Clinically Significant  LDL-C NIH 152 mg/dL  [] Clinically Significant  [x] Not Clinically Significant   Any further action needed to be taken per the PI? No  Vinie KYM Maxcy, MD, [facility], FNLA, FACP    [facility]  Medical Director of the Advanced Lipid Disorders &  Cardiovascular Risk Reduction Clinic Diplomate of the American Board of Clinical Lipidology Attending Cardiologist  Direct Dial: [phone]  Fax: [phone]  Website:  [email or web address]    Pre-Event Day 1 Visit  Date of Visit:26-Dec-2024                Subject Number: [id number]  During this visit the following activities were completed:  [x]   Enrollment/randomization      [x]  Review inclusion and exclusion  [x]  Medical History      [x]  Vital signs        Arm: left arm       BP: 131/73       HR:55       Resp: 16         Temp: 36.7       O2 Sat:100%  [x]  Physical Exam by [facility] Hails  [x]  EKG  [x]  Adverse events  [x]  Serious adverse device effects  [x] Vital Status/ non-fatal PEPs  [x]  Concomitant therapies reviewed  [x]  Central Labs       []  Urine Preg       [x]  Fasting glucose       [x]  HbA1c       [x] Fasting lipid panel & apolipoproteins       [x]  Lp(a)        [x] Coag       [x]  Chemistry       [x]  UACR       [x]  Hematology       [x]  Anti-olpasiran antibody   [x] Biomarker assessment    [x]  Biomarker development sample  [x] Pharmacokinetic assessments     [x]  Olpasiran serum PK (obtain 1-72 hors after IP  given)  [x] Genetic assessment      [x]  Genetic research (optional)   [x]  Olpasiran or placebo        [x]  Monitorat least 30 mins  Current Medications[1]      Ms Warzecha is here for Day 1 of Pre-event. She reports no visits to the ed or urgent care, no changes in her meds, and no abd pain since last seen. Vs taken at 0830. Blood drawn at 0840. Urine obtained at 0908. Injection given in right lower abd at 0915 tol well. Kit number [id number]. Scheduled next visit for Feb 17th at 0900. Post blood drawn at 1020.     [1]  Current Outpatient Medications:    ASTAXANTHIN PO, Take 1 tablet by mouth daily., Disp: , Rfl:    chlorthalidone  (HYGROTON ) 25 MG tablet, TAKE 1 TABLET (25 MG TOTAL) BY MOUTH DAILY., Disp: 90 tablet, Rfl: 2   Coenzyme Q10 (  Q-SORB CO Q-10) 100 MG capsule, Take 200 mg by mouth daily., Disp: , Rfl:    CYSTEINE PO, Take 1,200 mg by mouth daily. N-Acetyl-L-Cysteine, Disp: , Rfl:    Melatonin 10 MG/ML LIQD, Take 2 mLs by mouth daily., Disp: , Rfl:    Menaquinone-7 (VITAMIN K2 PO), Take 1 tablet by mouth daily., Disp: , Rfl:    milk thistle 175 MG tablet, Take 175 mg by mouth daily., Disp: , Rfl:    niacin (NIASPAN) 1000 MG CR tablet, Take 3,000 mg by mouth at bedtime., Disp: , Rfl:    NON FORMULARY, Isagenix products, Disp: , Rfl:    OVER THE COUNTER MEDICATION, daily. Z Pack: zinc 30 mg, Quercitin 500 mg, vitamin c 800 mg, vitamin d 125 mg, Disp: , Rfl:    Pine Bark, Pinus pinaster, 50 MG CAPS, Take 50 mg by mouth daily., Disp: , Rfl:    potassium chloride  (KLOR-CON ) 10 MEQ tablet, TAKE 1 TABLET BY MOUTH EVERY DAY, Disp: 90 tablet, Rfl: 3   Red Yeast Rice 600 MG TABS, Take 1,200 mg by mouth at bedtime., Disp: , Rfl:    valsartan  (DIOVAN ) 80 MG tablet, TAKE 1 TABLET BY MOUTH EVERY DAY, Disp: 90 tablet, Rfl: 2   Zinc 50 MG TABS, 1 tablet Orally Once a day, Disp: , Rfl:   Current Facility-Administered Medications:    Study - OCEAN(A)-PRE-EVENT - olpasiran 142 mg/mL or placebo  SQ injection (PI-Hilty), 142 mg, Subcutaneous, Q90 days,   "

## 2024-12-26 NOTE — Progress Notes (Unsigned)
" ° ° ° °  Research Note  Date:  12/26/2024  ID:  Bridget Salas, DOB 10/24/1956, MRN [id number] Study ID:   Chief Complaint and HPI  Bridget Salas is a 69 y.o. 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  Today patient denies chest pain, shortness of breath, lower extremity edema, fatigue, palpitations, diaphoresis, weakness, presyncope, syncope, orthopnea, and PND.    Studies Reviewed   EKG:  Sinus bradycardia with HR 51  Physical Exam  VS:  BP 131/73 (BP Location: Left Arm, Patient Position: Sitting, Cuff Size: Normal)   Pulse (!) 55   Temp 98.1 F (36.7 C) (Temporal)   Resp 16   SpO2 100%    Wt Readings from Last 3 Encounters:  12/05/24 161 lb 12.8 oz (73.4 kg)  09/10/24 172 lb 1.6 oz (78.1 kg)  01/24/24 175 lb (79.4 kg)     General: Well developed, well nourished, appearing in no acute distress. Head: Normocephalic, atraumatic.  Neck: Supple without bruits, no JVD. Lungs:  Resp regular and unlabored, CTA. Heart: RRR, S1, S2, no S3, S4, or murmur; no rub. Abdomen: Soft, non-tender, non-distended with normoactive bowel sounds. No hepatomegaly. No rebound/guarding. No obvious abdominal masses. Extremities: No clubbing, cyanosis, edema. Distal pedal pulses are 2+ bilaterally. Neuro: Alert and oriented X 3. Moves all extremities spontaneously. Psych: Normal affect.   Assessment and Plan   The patient was given the opportunity to ask any further questions about the study that were not addressed by the research nurse coordinators - he has no further questions and wants to proceed at this time.  Signed, Jon Nat Hails, PhD, PA-C 12/26/2024, 10:55 AM [phone] [facility] [address] [place], [place] [address]   "

## 2024-12-28 ENCOUNTER — Other Ambulatory Visit: Payer: Self-pay | Admitting: Nurse Practitioner

## 2024-12-28 DIAGNOSIS — Z79899 Other long term (current) drug therapy: Secondary | ICD-10-CM

## 2024-12-28 DIAGNOSIS — I10 Essential (primary) hypertension: Secondary | ICD-10-CM

## 2025-01-01 ENCOUNTER — Encounter: Payer: Self-pay | Admitting: *Deleted

## 2025-01-01 MED ORDER — VALSARTAN 80 MG PO TABS: 80.0000 mg | ORAL_TABLET | Freq: Every day | ORAL | 2 refills | Status: AC

## 2025-01-15 ENCOUNTER — Encounter

## 2025-01-21 ENCOUNTER — Ambulatory Visit: Admitting: Student in an Organized Health Care Education/Training Program
